# Patient Record
Sex: Female | Born: 1990 | Race: Black or African American | Hispanic: No | Marital: Single | Smoking: Never smoker
Health system: Southern US, Community
[De-identification: ages and names within clinical notes are randomized; demographics above are authoritative.]

## PROBLEM LIST (undated history)

## (undated) ENCOUNTER — Inpatient Hospital Stay (HOSPITAL_COMMUNITY): Payer: Self-pay

## (undated) DIAGNOSIS — Z789 Other specified health status: Secondary | ICD-10-CM

## (undated) HISTORY — PX: NO PAST SURGERIES: SHX2092

---
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
## 2013-03-17 NOTE — L&D Delivery Note (Signed)
Delivery Note At 12:54 AM a viable female was delivered via  (Presentation: OA ;  ).  APGAR: 9 - 9 , ; weight: 3150 grams .   Placenta status: spontaneous, intact , .  Cord: 3-vessel,  with the following complications: none .  Cord pH: none  Anesthesia:  none Episiotomy:  none Lacerations: none Suture Repair: none Est. Blood Loss (mL): 350  Mom to postpartum.  Baby to Couplet care / Skin to Skin.  Kristen Grimes A 11/16/2013, 1:24 AM

## 2013-03-25 ENCOUNTER — Encounter: Payer: Medicaid Other | Admitting: Advanced Practice Midwife

## 2013-04-10 ENCOUNTER — Inpatient Hospital Stay (HOSPITAL_COMMUNITY)
Admission: AD | Admit: 2013-04-10 | Discharge: 2013-04-10 | Disposition: A | Discharge status: Home / Self Care | Payer: Medicaid Other | Source: Ambulatory Visit | Attending: Obstetrics and Gynecology | Admitting: Obstetrics and Gynecology

## 2013-04-10 ENCOUNTER — Encounter (HOSPITAL_COMMUNITY): Payer: Self-pay | Admitting: Family

## 2013-04-10 ENCOUNTER — Inpatient Hospital Stay (HOSPITAL_COMMUNITY): Payer: Self-pay

## 2013-04-10 DIAGNOSIS — R102 Pelvic and perineal pain: Secondary | ICD-10-CM

## 2013-04-10 DIAGNOSIS — O26891 Other specified pregnancy related conditions, first trimester: Secondary | ICD-10-CM

## 2013-04-10 DIAGNOSIS — N949 Unspecified condition associated with female genital organs and menstrual cycle: Secondary | ICD-10-CM

## 2013-04-10 DIAGNOSIS — O99891 Other specified diseases and conditions complicating pregnancy: Secondary | ICD-10-CM | POA: Insufficient documentation

## 2013-04-10 DIAGNOSIS — M549 Dorsalgia, unspecified: Secondary | ICD-10-CM | POA: Insufficient documentation

## 2013-04-10 DIAGNOSIS — O9989 Other specified diseases and conditions complicating pregnancy, childbirth and the puerperium: Secondary | ICD-10-CM

## 2013-04-10 DIAGNOSIS — R109 Unspecified abdominal pain: Secondary | ICD-10-CM | POA: Insufficient documentation

## 2013-04-10 HISTORY — DX: Other specified health status: Z78.9

## 2013-04-10 LAB — CBC
HCT: 37.8 % (ref 36.0–46.0)
Hemoglobin: 13.2 g/dL (ref 12.0–15.0)
MCH: 31 pg (ref 26.0–34.0)
MCHC: 34.9 g/dL (ref 30.0–36.0)
MCV: 88.7 fL (ref 78.0–100.0)
PLATELETS: 261 10*3/uL (ref 150–400)
RBC: 4.26 MIL/uL (ref 3.87–5.11)
RDW: 14.1 % (ref 11.5–15.5)
WBC: 6.2 10*3/uL (ref 4.0–10.5)

## 2013-04-10 LAB — WET PREP, GENITAL
Trich, Wet Prep: NONE SEEN
Yeast Wet Prep HPF POC: NONE SEEN

## 2013-04-10 LAB — URINALYSIS, ROUTINE W REFLEX MICROSCOPIC
Bilirubin Urine: NEGATIVE
Glucose, UA: NEGATIVE mg/dL
Hgb urine dipstick: NEGATIVE
Ketones, ur: NEGATIVE mg/dL
LEUKOCYTES UA: NEGATIVE
NITRITE: NEGATIVE
PROTEIN: NEGATIVE mg/dL
SPECIFIC GRAVITY, URINE: 1.025 (ref 1.005–1.030)
UROBILINOGEN UA: 0.2 mg/dL (ref 0.0–1.0)
pH: 6 (ref 5.0–8.0)

## 2013-04-10 LAB — ABO/RH: ABO/RH(D): B POS

## 2013-04-10 LAB — POCT PREGNANCY, URINE
PREG TEST UR: POSITIVE — AB
PREG TEST UR: POSITIVE — AB

## 2013-04-10 LAB — HCG, QUANTITATIVE, PREGNANCY: HCG, BETA CHAIN, QUANT, S: 139990 m[IU]/mL — AB (ref <5)

## 2013-04-10 NOTE — Discharge Instructions (Signed)
Pregnancy - First Trimester  During sexual intercourse, millions of sperm go into the vagina. Only 1 sperm will penetrate and fertilize the female egg while it is in the Fallopian tube. One week later, the fertilized egg implants into the wall of the uterus. An embryo begins to develop into a baby. At 6 to 8 weeks, the eyes and face are formed and the heartbeat can be seen on ultrasound. At the end of 12 weeks (first trimester), all the baby's organs are formed. Now that you are pregnant, you will want to do everything you can to have a healthy baby. Two of the most important things are to get good prenatal care and follow your caregiver's instructions. Prenatal care is all the medical care you receive before the baby's birth. It is given to prevent, find, and treat problems during the pregnancy and childbirth.  PRENATAL EXAMS  · During prenatal visits, your weight, blood pressure, and urine are checked. This is done to make sure you are healthy and progressing normally during the pregnancy.  · A pregnant woman should gain 25 to 35 pounds during the pregnancy. However, if you are overweight or underweight, your caregiver will advise you regarding your weight.  · Your caregiver will ask and answer questions for you.  · Blood work, cervical cultures, other necessary tests, and a Pap test are done during your prenatal exams. These tests are done to check on your health and the probable health of your baby. Tests are strongly recommended and done for HIV with your permission. This is the virus that causes AIDS. These tests are done because medicines can be given to help prevent your baby from being born with this infection should you have been infected without knowing it. Blood work is also used to find out your blood type, previous infections, and follow your blood levels (hemoglobin).  · Low hemoglobin (anemia) is common during pregnancy. Iron and vitamins are given to help prevent this. Later in the pregnancy, blood  tests for diabetes will be done along with any other tests if any problems develop.  · You may need other tests to make sure you and the baby are doing well.  CHANGES DURING THE FIRST TRIMESTER   Your body goes through many changes during pregnancy. They vary from person to person. Talk to your caregiver about changes you notice and are concerned about. Changes can include:  · Your menstrual period stops.  · The egg and sperm carry the genes that determine what you look like. Genes from you and your partner are forming a baby. The female genes determine whether the baby is a boy or a girl.  · Your body increases in girth and you may feel bloated.  · Feeling sick to your stomach (nauseous) and throwing up (vomiting). If the vomiting is uncontrollable, call your caregiver.  · Your breasts will begin to enlarge and become tender.  · Your nipples may stick out more and become darker.  · The need to urinate more. Painful urination may mean you have a bladder infection.  · Tiring easily.  · Loss of appetite.  · Cravings for certain kinds of food.  · At first, you may gain or lose a couple of pounds.  · You may have changes in your emotions from day to day (excited to be pregnant or concerned something may go wrong with the pregnancy and baby).  · You may have more vivid and strange dreams.  HOME CARE INSTRUCTIONS   ·   It is very important to avoid all smoking, alcohol and non-prescribed drugs during your pregnancy. These affect the formation and growth of the baby. Avoid chemicals while pregnant to ensure the delivery of a healthy infant.  · Start your prenatal visits by the 12th week of pregnancy. They are usually scheduled monthly at first, then more often in the last 2 months before delivery. Keep your caregiver's appointments. Follow your caregiver's instructions regarding medicine use, blood and lab tests, exercise, and diet.  · During pregnancy, you are providing food for you and your baby. Eat regular, well-balanced  meals. Choose foods such as meat, fish, milk and other low fat dairy products, vegetables, fruits, and whole-grain breads and cereals. Your caregiver will tell you of the ideal weight gain.  · You can help morning sickness by keeping soda crackers at the bedside. Eat a couple before arising in the morning. You may want to use the crackers without salt on them.  · Eating 4 to 5 small meals rather than 3 large meals a day also may help the nausea and vomiting.  · Drinking liquids between meals instead of during meals also seems to help nausea and vomiting.  · A physical sexual relationship may be continued throughout pregnancy if there are no other problems. Problems may be early (premature) leaking of amniotic fluid from the membranes, vaginal bleeding, or belly (abdominal) pain.  · Exercise regularly if there are no restrictions. Check with your caregiver or physical therapist if you are unsure of the safety of some of your exercises. Greater weight gain will occur in the last 2 trimesters of pregnancy. Exercising will help:  · Control your weight.  · Keep you in shape.  · Prepare you for labor and delivery.  · Help you lose your pregnancy weight after you deliver your baby.  · Wear a good support or jogging bra for breast tenderness during pregnancy. This may help if worn during sleep too.  · Ask when prenatal classes are available. Begin classes when they are offered.  · Do not use hot tubs, steam rooms, or saunas.  · Wear your seat belt when driving. This protects you and your baby if you are in an accident.  · Avoid raw meat, uncooked cheese, cat litter boxes, and soil used by cats throughout the pregnancy. These carry germs that can cause birth defects in the baby.  · The first trimester is a good time to visit your dentist for your dental health. Getting your teeth cleaned is okay. Use a softer toothbrush and brush gently during pregnancy.  · Ask for help if you have financial, counseling, or nutritional needs  during pregnancy. Your caregiver will be able to offer counseling for these needs as well as refer you for other special needs.  · Do not take any medicines or herbs unless told by your caregiver.  · Inform your caregiver if there is any mental or physical domestic violence.  · Make a list of emergency phone numbers of family, friends, hospital, and police and fire departments.  · Write down your questions. Take them to your prenatal visit.  · Do not douche.  · Do not cross your legs.  · If you have to stand for long periods of time, rotate you feet or take small steps in a circle.  · You may have more vaginal secretions that may require a sanitary pad. Do not use tampons or scented sanitary pads.  MEDICINES AND DRUG USE IN PREGNANCY  ·   Take prenatal vitamins as directed. The vitamin should contain 1 milligram of folic acid. Keep all vitamins out of reach of children. Only a couple vitamins or tablets containing iron may be fatal to a baby or young child when ingested.  · Avoid use of all medicines, including herbs, over-the-counter medicines, not prescribed or suggested by your caregiver. Only take over-the-counter or prescription medicines for pain, discomfort, or fever as directed by your caregiver. Do not use aspirin, ibuprofen, or naproxen unless directed by your caregiver.  · Let your caregiver also know about herbs you may be using.  · Alcohol is related to a number of birth defects. This includes fetal alcohol syndrome. All alcohol, in any form, should be avoided completely. Smoking will cause low birth rate and premature babies.  · Street or illegal drugs are very harmful to the baby. They are absolutely forbidden. A baby born to an addicted mother will be addicted at birth. The baby will go through the same withdrawal an adult does.  · Let your caregiver know about any medicines that you have to take and for what reason you take them.  SEEK MEDICAL CARE IF:   You have any concerns or worries during your  pregnancy. It is better to call with your questions if you feel they cannot wait, rather than worry about them.  SEEK IMMEDIATE MEDICAL CARE IF:   · An unexplained oral temperature above 102° F (38.9° C) develops, or as your caregiver suggests.  · You have leaking of fluid from the vagina (birth canal). If leaking membranes are suspected, take your temperature and inform your caregiver of this when you call.  · There is vaginal spotting or bleeding. Notify your caregiver of the amount and how many pads are used.  · You develop a bad smelling vaginal discharge with a change in the color.  · You continue to feel sick to your stomach (nauseated) and have no relief from remedies suggested. You vomit blood or coffee ground-like materials.  · You lose more than 2 pounds of weight in 1 week.  · You gain more than 2 pounds of weight in 1 week and you notice swelling of your face, hands, feet, or legs.  · You gain 5 pounds or more in 1 week (even if you do not have swelling of your hands, face, legs, or feet).  · You get exposed to German measles and have never had them.  · You are exposed to fifth disease or chickenpox.  · You develop belly (abdominal) pain. Round ligament discomfort is a common non-cancerous (benign) cause of abdominal pain in pregnancy. Your caregiver still must evaluate this.  · You develop headache, fever, diarrhea, pain with urination, or shortness of breath.  · You fall or are in a car accident or have any kind of trauma.  · There is mental or physical violence in your home.  Document Released: 02/25/2001 Document Revised: 11/26/2011 Document Reviewed: 08/29/2008  Exit[employer]® Patient Information ©2014 [employer].

## 2013-04-10 NOTE — MAU Provider Note (Signed)
History     CSN: [id number]  Arrival date and time: 04/10/13 1006   First Provider Initiated Contact with Patient 04/10/13 1037      Chief Complaint  Patient presents with  . Abdominal Pain  . Back Pain   Abdominal Pain  Back Pain Associated symptoms include abdominal pain.    Kristen Grimes is a 23 y.o. G1P1001 at Unknown who presents today with lower abdominal and back pain for 4 days. She did not know she was pregnant. She denies any bleeding or vaginal discharge.   Past Medical History  Diagnosis Date  . Medical history non-contributory     Past Surgical History  Procedure Laterality Date  . No past surgeries      History reviewed. No pertinent family history.  History  Substance Use Topics  . Smoking status: Never Smoker   . Smokeless tobacco: Never Used  . Alcohol Use: No    Allergies: No Known Allergies  Prescriptions prior to admission  Medication Sig Dispense Refill  . acetaminophen (TYLENOL) 325 MG tablet Take 325-650 mg by mouth every 6 (six) hours as needed for mild pain or headache.        Review of Systems  Gastrointestinal: Positive for abdominal pain.  Musculoskeletal: Positive for back pain.   Physical Exam   Blood pressure 119/64, pulse 85, temperature 98.1 F (36.7 C), temperature source Oral, resp. rate 14, height 5\' 8"  (1.727 m), weight 71.668 kg (158 lb), unknown if currently breastfeeding.  Physical Exam  Nursing note and vitals reviewed. Constitutional: She is oriented to person, place, and time. She appears well-developed and well-nourished. No distress.  Cardiovascular: Normal rate.   Respiratory: Effort normal.  GI: Soft. There is no tenderness.  Genitourinary:   External: no lesion Vagina: small amount of white discharge Cervix: pink, smooth, no CMT Uterus: NSSC Adnexa: NT   Neurological: She is alert and oriented to person, place, and time.  Skin: Skin is warm and dry.  Psychiatric: She has a normal mood and  affect.    MAU Course  Procedures  Results for orders placed during the hospital encounter of 04/10/13 (from the past 24 hour(s))  URINALYSIS, ROUTINE W REFLEX MICROSCOPIC     Status: None   Collection Time    04/10/13 10:15 AM      Result Value Range   Color, Urine YELLOW  YELLOW   APPearance CLEAR  CLEAR   Specific Gravity, Urine 1.025  1.005 - 1.030   pH 6.0  5.0 - 8.0   Glucose, UA NEGATIVE  NEGATIVE mg/dL   Hgb urine dipstick NEGATIVE  NEGATIVE   Bilirubin Urine NEGATIVE  NEGATIVE   Ketones, ur NEGATIVE  NEGATIVE mg/dL   Protein, ur NEGATIVE  NEGATIVE mg/dL   Urobilinogen, UA 0.2  0.0 - 1.0 mg/dL   Nitrite NEGATIVE  NEGATIVE   Leukocytes, UA NEGATIVE  NEGATIVE  POCT PREGNANCY, URINE     Status: Abnormal   Collection Time    04/10/13 10:24 AM      Result Value Range   Preg Test, Ur POSITIVE (*) NEGATIVE  CBC     Status: None   Collection Time    04/10/13 10:35 AM      Result Value Range   WBC 6.2  4.0 - 10.5 K/uL   RBC 4.26  3.87 - 5.11 MIL/uL   Hemoglobin 13.2  12.0 - 15.0 g/dL   HCT 40.937.8  81.136.0 - 91.446.0 %   MCV 88.7  78.0 - 100.0  fL   MCH 31.0  26.0 - 34.0 pg   MCHC 34.9  30.0 - 36.0 g/dL   RDW 40.9  81.1 - 91.4 %   Platelets 261  150 - 400 K/uL  ABO/RH     Status: None   Collection Time    04/10/13 10:35 AM      Result Value Range   ABO/RH(D) B POS    HCG, QUANTITATIVE, PREGNANCY     Status: Abnormal   Collection Time    04/10/13 10:35 AM      Result Value Range   hCG, Beta Chain, Quant, [place] [id number] (*) <5 mIU/mL  WET PREP, GENITAL     Status: Abnormal   Collection Time    04/10/13 10:50 AM      Result Value Range   Yeast Wet Prep HPF POC NONE SEEN  NONE SEEN   Trich, Wet Prep NONE SEEN  NONE SEEN   Clue Cells Wet Prep HPF POC FEW (*) NONE SEEN   WBC, Wet Prep HPF POC FEW (*) NONE SEEN   US Ob Comp Less 14 Wks  04/10/2013   CLINICAL DATA:  Pelvic pain.  Unknown last menstrual period.  EXAM: OBSTETRIC <14 WK ULTRASOUND  TECHNIQUE: Transabdominal ultrasound  was performed for evaluation of the gestation as well as the maternal uterus and adnexal regions.  COMPARISON:  None this pregnancy  FINDINGS: Intrauterine gestational sac: Visualized/normal in shape.  Yolk sac:  Visualized  Embryo:  Visualized  Cardiac Activity: Visualized  Heart Rate: 179 bpm  CRL:   26  mm   9 w 3d                  [place] EDC: 11/10/13  Maternal uterus/adnexae: Normal ovaries.  No free fluid.  IMPRESSION: Single live intrauterine gestation measuring 9 weeks 3 days by crown-rump length on today's exam, EDC by today's ultrasound 11/10/2013. No acute abnormality.   Electronically Signed   By: Christiana Pellant M.D.   On: 04/10/2013 13:12     Assessment and Plan   1. Pregnancy related pelvic pain in first trimester, antepartum    First trimester precautions given Start [facility] as soon as possible Return to MAU as needed Pregnancy verification letter given.   Follow-up Information   Schedule an appointment as soon as possible for a visit with [facility] [place].   Contact information:   [address] [place] [address] [phone]      Tawnya Crook 04/10/2013, 11:31 AM

## 2013-04-10 NOTE — MAU Note (Signed)
23 yo, presents to MAU with c/o abdominal pain and intermittent back pain x 4 days; reports constipation, denies N/V/D, fever, chills, sweats.  LMP 02/16/13. No BC.

## 2013-04-11 LAB — GC/CHLAMYDIA PROBE AMP
CT PROBE, AMP APTIMA: POSITIVE — AB
GC Probe RNA: NEGATIVE

## 2013-04-12 ENCOUNTER — Encounter (HOSPITAL_COMMUNITY): Payer: Self-pay | Admitting: *Deleted

## 2013-04-13 NOTE — MAU Provider Note (Signed)
Attestation of Attending Supervision of Advanced Practitioner: Evaluation and management procedures were performed by the PA/NP/CNM/OB Fellow under my supervision/collaboration. Chart reviewed and agree with management and plan.  Kip Cropp V 04/13/2013 5:24 PM   

## 2013-05-23 ENCOUNTER — Other Ambulatory Visit (HOSPITAL_COMMUNITY): Payer: Self-pay | Admitting: Nurse Practitioner

## 2013-05-23 DIAGNOSIS — Z3689 Encounter for other specified antenatal screening: Secondary | ICD-10-CM

## 2013-05-31 ENCOUNTER — Encounter: Payer: Medicaid Other | Admitting: Obstetrics

## 2013-05-31 ENCOUNTER — Encounter: Payer: Medicaid Other | Admitting: Advanced Practice Midwife

## 2013-06-14 ENCOUNTER — Ambulatory Visit: Payer: Medicaid Other

## 2013-06-14 ENCOUNTER — Encounter: Payer: Self-pay | Admitting: Obstetrics

## 2013-06-14 ENCOUNTER — Ambulatory Visit (INDEPENDENT_AMBULATORY_CARE_PROVIDER_SITE_OTHER): Payer: Medicaid Other | Admitting: Obstetrics

## 2013-06-14 VITALS — BP 120/70 | Temp 98.9°F | Wt 163.0 lb

## 2013-06-14 DIAGNOSIS — Z3480 Encounter for supervision of other normal pregnancy, unspecified trimester: Secondary | ICD-10-CM

## 2013-06-14 DIAGNOSIS — Z3687 Encounter for antenatal screening for uncertain dates: Secondary | ICD-10-CM

## 2013-06-14 DIAGNOSIS — J309 Allergic rhinitis, unspecified: Secondary | ICD-10-CM

## 2013-06-14 DIAGNOSIS — Z1389 Encounter for screening for other disorder: Secondary | ICD-10-CM

## 2013-06-14 DIAGNOSIS — J302 Other seasonal allergic rhinitis: Secondary | ICD-10-CM

## 2013-06-14 LAB — VITAMIN D 25 HYDROXY (VIT D DEFICIENCY, FRACTURES): VIT D 25 HYDROXY: 31 ng/mL (ref 30–89)

## 2013-06-14 LAB — WET PREP BY MOLECULAR PROBE
CANDIDA SPECIES: NEGATIVE
Gardnerella vaginalis: NEGATIVE
Trichomonas vaginosis: NEGATIVE

## 2013-06-14 LAB — OB RESULTS CONSOLE GC/CHLAMYDIA
Chlamydia: NEGATIVE
Gonorrhea: NEGATIVE

## 2013-06-14 LAB — HIV ANTIBODY (ROUTINE TESTING W REFLEX): HIV: NONREACTIVE

## 2013-06-14 MED ORDER — CITRANATAL HARMONY 27-1-250 MG PO CAPS: 1.0000 | ORAL_CAPSULE | Freq: Every day | ORAL | Status: DC

## 2013-06-14 MED ORDER — LORATADINE 10 MG PO TABS: 10.0000 mg | ORAL_TABLET | Freq: Every day | ORAL | Status: DC

## 2013-06-14 NOTE — Progress Notes (Signed)
Subjective:    Kristen Grimes is being seen today for her first obstetrical visit.  This is not a planned pregnancy. She is at 5564w6d gestation. Her obstetrical history is significant for None. Relationship with FOB: significant other, not living together. Patient Is unsure if she  intends to breast feed. Pregnancy history fully reviewed. Patient states she is unsure of her LMP. Patient states she was taking sudafed but stopped taking it 2 months ago. Patient states she has ran out of prenatal vitamins.  Pulse: 87  Menstrual History: OB History   Grav Para Term Preterm Abortions TAB SAB Ect Mult Living   2 1 1       1       Menarche age: [age] (Patient states she is unsure.) Patient's last menstrual period was 02/16/2013.    The following portions of the patient's history were reviewed and updated as appropriate: allergies, current medications, past family history, past medical history, past social history, past surgical history and problem list.  Review of Systems Pertinent items are noted in HPI.    Objective:    General appearance: alert and no distress Abdomen: normal findings: soft, non-tender Pelvic: cervix normal in appearance, external genitalia normal, no adnexal masses or tenderness, no cervical motion tenderness, rectovaginal septum normal and vagina normal without discharge Extremities: extremities normal, atraumatic, no cyanosis or edema    Assessment:    Pregnancy at 4764w6d weeks    Plan:    Initial labs drawn. Prenatal vitamins.  Counseling provided regarding continued use of seat belts, cessation of alcohol consumption, smoking or use of illicit drugs; infection precautions i.e., influenza/TDAP immunizations, toxoplasmosis,CMV, parvovirus, listeria and varicella; workplace safety, exercise during pregnancy; routine dental care, safe medications, sexual activity, hot tubs, saunas, pools, travel, caffeine use, fish and methlymercury, potential toxins, hair treatments,  varicose veins Weight gain recommendations per IOM guidelines reviewed: underweight/BMI< 18.5--> gain 28 - 40 lbs; normal weight/BMI 18.5 - 24.9--> gain 25 - 35 lbs; overweight/BMI 25 - 29.9--> gain 15 - 25 lbs; obese/BMI >30->gain  11 - 20 lbs Problem list reviewed and updated. FIRST/CF mutation testing/NIPT/QUAD SCREEN discussed: requested. Role of ultrasound in pregnancy discussed; fetal survey: requested. Amniocentesis discussed: not indicated. VBAC calculator score: VBAC consent form provided Follow up in 4 weeks. 50% of 20 min visit spent on counseling and coordination of care.

## 2013-06-15 LAB — OBSTETRIC PANEL
Antibody Screen: NEGATIVE
BASOS ABS: 0 10*3/uL (ref 0.0–0.1)
Basophils Relative: 0 % (ref 0–1)
EOS PCT: 1 % (ref 0–5)
Eosinophils Absolute: 0.1 10*3/uL (ref 0.0–0.7)
HEMATOCRIT: 39 % (ref 36.0–46.0)
Hemoglobin: 13.1 g/dL (ref 12.0–15.0)
Hepatitis B Surface Ag: NEGATIVE
LYMPHS ABS: 2.8 10*3/uL (ref 0.7–4.0)
LYMPHS PCT: 39 % (ref 12–46)
MCH: 30.5 pg (ref 26.0–34.0)
MCHC: 33.6 g/dL (ref 30.0–36.0)
MCV: 90.7 fL (ref 78.0–100.0)
MONO ABS: 0.6 10*3/uL (ref 0.1–1.0)
Monocytes Relative: 9 % (ref 3–12)
NEUTROS ABS: 3.6 10*3/uL (ref 1.7–7.7)
Neutrophils Relative %: 51 % (ref 43–77)
PLATELETS: 241 10*3/uL (ref 150–400)
RBC: 4.3 MIL/uL (ref 3.87–5.11)
RDW: 15 % (ref 11.5–15.5)
RUBELLA: 2.52 {index} — AB (ref <0.90)
Rh Type: POSITIVE
WBC: 7.1 10*3/uL (ref 4.0–10.5)

## 2013-06-15 LAB — VARICELLA ZOSTER ANTIBODY, IGG: VARICELLA IGG: 1995 {index} — AB (ref <135.00)

## 2013-06-16 LAB — HEMOGLOBINOPATHY EVALUATION
HGB F QUANT: 0 % (ref 0.0–2.0)
HGB S QUANTITAION: 0 %
Hemoglobin Other: 0 %
Hgb A2 Quant: 2.8 % (ref 2.2–3.2)
Hgb A: 97.2 % (ref 96.8–97.8)

## 2013-06-16 LAB — GC/CHLAMYDIA PROBE AMP
CT Probe RNA: NEGATIVE
GC Probe RNA: NEGATIVE

## 2013-06-18 ENCOUNTER — Encounter: Payer: Self-pay | Admitting: Obstetrics

## 2013-06-18 DIAGNOSIS — J302 Other seasonal allergic rhinitis: Secondary | ICD-10-CM | POA: Insufficient documentation

## 2013-06-24 ENCOUNTER — Ambulatory Visit (HOSPITAL_COMMUNITY)
Admission: RE | Admit: 2013-06-24 | Discharge: 2013-06-24 | Disposition: A | Discharge status: Home / Self Care | Payer: Medicaid Other | Source: Ambulatory Visit | Attending: Nurse Practitioner | Admitting: Nurse Practitioner

## 2013-06-24 DIAGNOSIS — Z3689 Encounter for other specified antenatal screening: Secondary | ICD-10-CM

## 2013-07-12 ENCOUNTER — Ambulatory Visit (INDEPENDENT_AMBULATORY_CARE_PROVIDER_SITE_OTHER): Payer: Medicaid Other | Admitting: Obstetrics

## 2013-07-12 VITALS — BP 119/67 | HR 79 | Temp 98.7°F | Wt 165.0 lb

## 2013-07-12 DIAGNOSIS — Z3480 Encounter for supervision of other normal pregnancy, unspecified trimester: Secondary | ICD-10-CM

## 2013-07-12 LAB — POCT URINALYSIS DIPSTICK
BILIRUBIN UA: NEGATIVE
Blood, UA: NEGATIVE
GLUCOSE UA: NEGATIVE
KETONES UA: NEGATIVE
LEUKOCYTES UA: NEGATIVE
NITRITE UA: NEGATIVE
PH UA: 8
PROTEIN UA: NEGATIVE
Spec Grav, UA: <=1.005
UROBILINOGEN UA: NEGATIVE

## 2013-07-13 ENCOUNTER — Encounter: Payer: Self-pay | Admitting: Obstetrics

## 2013-07-13 LAB — CULTURE, OB URINE
Colony Count: NO GROWTH
Organism ID, Bacteria: NO GROWTH

## 2013-07-13 NOTE — Progress Notes (Signed)
Subjective:    Margorie Johneicorah Pitt is a 23 y.o. female being seen today for her obstetrical visit. She is at 4532w6d gestation. Patient reports: no complaints . Fetal movement: normal.  Problem List Items Addressed This Visit   None    Visit Diagnoses   Supervision of other normal pregnancy    -  Primary    Relevant Orders       POCT urinalysis dipstick (Completed)       Culture, OB Urine      Patient Active Problem List   Diagnosis Date Noted  . Seasonal rhinitis 06/18/2013   Objective:    BP 119/67  Pulse 79  Temp(Src) 98.7 F (37.1 C)  Wt 165 lb (74.844 kg)  LMP 02/16/2013 FHT: 150 BPM  Uterine Size: size equals dates     Assessment:    Pregnancy @ 7232w6d    Plan:    OBGCT: discussed and ordered.  Labs, problem list reviewed and updated 2 hr GTT planned Follow up in 2 weeks.

## 2013-08-09 ENCOUNTER — Other Ambulatory Visit: Payer: Medicaid Other

## 2013-08-09 ENCOUNTER — Encounter: Payer: Self-pay | Admitting: Obstetrics

## 2013-08-09 ENCOUNTER — Ambulatory Visit (INDEPENDENT_AMBULATORY_CARE_PROVIDER_SITE_OTHER): Payer: Medicaid Other | Admitting: Obstetrics

## 2013-08-09 VITALS — BP 117/67 | HR 80 | Temp 97.4°F | Wt 174.0 lb

## 2013-08-09 DIAGNOSIS — Z3480 Encounter for supervision of other normal pregnancy, unspecified trimester: Secondary | ICD-10-CM

## 2013-08-09 LAB — POCT URINALYSIS DIPSTICK
Bilirubin, UA: NEGATIVE
Blood, UA: NEGATIVE
Glucose, UA: NEGATIVE
KETONES UA: NEGATIVE
Leukocytes, UA: NEGATIVE
Nitrite, UA: NEGATIVE
PH UA: 8
PROTEIN UA: NEGATIVE
Urobilinogen, UA: NEGATIVE

## 2013-08-09 LAB — CBC
HEMATOCRIT: 34.7 % — AB (ref 36.0–46.0)
Hemoglobin: 11.8 g/dL — ABNORMAL LOW (ref 12.0–15.0)
MCH: 30.7 pg (ref 26.0–34.0)
MCHC: 34 g/dL (ref 30.0–36.0)
MCV: 90.4 fL (ref 78.0–100.0)
PLATELETS: 233 10*3/uL (ref 150–400)
RBC: 3.84 MIL/uL — AB (ref 3.87–5.11)
RDW: 15.1 % (ref 11.5–15.5)
WBC: 7.4 10*3/uL (ref 4.0–10.5)

## 2013-08-09 NOTE — Progress Notes (Signed)
Subjective:    Kristen Grimes is a 23 y.o. female being seen today for her obstetrical visit. She is at 103w5d gestation. Patient reports: no complaints . Fetal movement: normal.  Problem List Items Addressed This Visit   None    Visit Diagnoses   Supervision of other normal pregnancy    -  Primary    Relevant Orders       POCT urinalysis dipstick       Glucose Tolerance, 2 Hours w/1 Hour       CBC       HIV antibody       RPR      Patient Active Problem List   Diagnosis Date Noted  . Seasonal rhinitis 06/18/2013   Objective:    BP 117/67  Pulse 80  Temp(Src) 97.4 F (36.3 C)  Wt 174 lb (78.926 kg)  LMP 02/16/2013 FHT: 150 BPM  Uterine Size: size equals dates     Assessment:    Pregnancy @ [redacted]w[redacted]d    Plan:    OBGCT: ordered.  Labs, problem list reviewed and updated 2 hr GTT planned Follow up in 2 weeks.

## 2013-08-10 LAB — HIV ANTIBODY (ROUTINE TESTING W REFLEX): HIV 1&2 Ab, 4th Generation: NONREACTIVE

## 2013-08-10 LAB — GLUCOSE TOLERANCE, 2 HOURS W/ 1HR
GLUCOSE, 2 HOUR: 71 mg/dL (ref 70–139)
GLUCOSE, FASTING: 59 mg/dL — AB (ref 70–99)
Glucose, 1 hour: 66 mg/dL — ABNORMAL LOW (ref 70–170)

## 2013-08-10 LAB — RPR

## 2013-09-05 ENCOUNTER — Encounter: Payer: Self-pay | Admitting: Obstetrics

## 2013-09-05 ENCOUNTER — Ambulatory Visit (INDEPENDENT_AMBULATORY_CARE_PROVIDER_SITE_OTHER): Payer: Medicaid Other | Admitting: Obstetrics

## 2013-09-05 VITALS — BP 108/68 | HR 81 | Temp 98.5°F | Wt 179.0 lb

## 2013-09-05 DIAGNOSIS — Z3480 Encounter for supervision of other normal pregnancy, unspecified trimester: Secondary | ICD-10-CM

## 2013-09-05 DIAGNOSIS — Z3483 Encounter for supervision of other normal pregnancy, third trimester: Secondary | ICD-10-CM

## 2013-09-05 NOTE — Progress Notes (Signed)
Subjective:    Kristen Grimes is a 23 y.o. female being seen today for her obstetrical visit. She is at 5857w4d gestation. Patient reports occasional contractions. Fetal movement: normal.  Problem List Items Addressed This Visit   None    Visit Diagnoses   Encounter for supervision of other normal pregnancy in third trimester    -  Primary    Relevant Orders       POCT urinalysis dipstick      Patient Active Problem List   Diagnosis Date Noted  . Seasonal rhinitis 06/18/2013   Objective:    BP 108/68  Pulse 81  Temp(Src) 98.5 F (36.9 C)  Wt 179 lb (81.194 kg)  LMP 02/16/2013 FHT:  150 BPM  Uterine Size: size equals dates  Presentation: unsure     Assessment:    Pregnancy @ 2857w4d weeks   Plan:     labs reviewed, problem list updated Consent signed. GBS sent TDAP offered  Rhogam given for RH negative Pediatrician: discussed. Infant feeding: plans to breastfeed. Maternity leave: discussed. Cigarette smoking: never smoked. Orders Placed This Encounter  Procedures  . POCT urinalysis dipstick   No orders of the defined types were placed in this encounter.   Follow up in 1 Week.

## 2013-09-07 LAB — POCT URINALYSIS DIPSTICK
Bilirubin, UA: NEGATIVE
Blood, UA: NEGATIVE
Glucose, UA: NEGATIVE
KETONES UA: NEGATIVE
LEUKOCYTES UA: NEGATIVE
NITRITE UA: NEGATIVE
PROTEIN UA: NEGATIVE
Spec Grav, UA: 1.02
Urobilinogen, UA: NEGATIVE
pH, UA: 6

## 2013-09-13 ENCOUNTER — Ambulatory Visit (INDEPENDENT_AMBULATORY_CARE_PROVIDER_SITE_OTHER): Payer: Medicaid Other | Admitting: Advanced Practice Midwife

## 2013-09-13 ENCOUNTER — Encounter: Payer: Self-pay | Admitting: Advanced Practice Midwife

## 2013-09-13 VITALS — BP 110/70 | HR 80 | Temp 98.1°F | Wt 178.0 lb

## 2013-09-13 DIAGNOSIS — Z3480 Encounter for supervision of other normal pregnancy, unspecified trimester: Secondary | ICD-10-CM

## 2013-09-13 DIAGNOSIS — Z3483 Encounter for supervision of other normal pregnancy, third trimester: Secondary | ICD-10-CM

## 2013-09-13 LAB — POCT URINALYSIS DIPSTICK
GLUCOSE UA: NEGATIVE
Ketones, UA: NEGATIVE
Nitrite, UA: NEGATIVE
Protein, UA: NEGATIVE
RBC UA: NEGATIVE
SPEC GRAV UA: 1.01
pH, UA: 7

## 2013-09-13 NOTE — Progress Notes (Signed)
Subjective: Kristen Grimes is a 23 y.o. at 31 weeks by 9wk US  Patient denies vaginal leaking of fluid or bleeding, denies contractions.  Reports positive fetal movment.  Denies concerns today.  Objective: Filed Vitals:   09/13/13 0938  BP: 110/70  Pulse: 80  Temp: 98.1 F (36.7 C)   150 FHR 31 Fundal Height Fetal Position cephalic  Assessment: Patient Active Problem List   Diagnosis Date Noted  . Seasonal rhinitis 06/18/2013    Plan: Patient to return to clinic in 2 weeks Reviewed plan for GBS @ 36 weeks. Reviewed warning signs in pregnancy. Patient to call with concerns PRN. Reviewed triage location. Discuss BCM, pediatrician NV.  Amy Wilson SingerWren CNM

## 2013-09-27 ENCOUNTER — Ambulatory Visit (INDEPENDENT_AMBULATORY_CARE_PROVIDER_SITE_OTHER): Payer: Medicaid Other | Admitting: Advanced Practice Midwife

## 2013-09-27 VITALS — BP 112/62 | HR 103 | Temp 97.9°F | Wt 181.0 lb

## 2013-09-27 DIAGNOSIS — K219 Gastro-esophageal reflux disease without esophagitis: Secondary | ICD-10-CM

## 2013-09-27 DIAGNOSIS — Z3480 Encounter for supervision of other normal pregnancy, unspecified trimester: Secondary | ICD-10-CM

## 2013-09-27 DIAGNOSIS — O9989 Other specified diseases and conditions complicating pregnancy, childbirth and the puerperium: Secondary | ICD-10-CM

## 2013-09-27 DIAGNOSIS — O99613 Diseases of the digestive system complicating pregnancy, third trimester: Secondary | ICD-10-CM

## 2013-09-27 DIAGNOSIS — Z3483 Encounter for supervision of other normal pregnancy, third trimester: Secondary | ICD-10-CM

## 2013-09-27 LAB — POCT URINALYSIS DIPSTICK
Bilirubin, UA: NEGATIVE
Blood, UA: NEGATIVE
Glucose, UA: NEGATIVE
KETONES UA: NEGATIVE
LEUKOCYTES UA: NEGATIVE
Nitrite, UA: NEGATIVE
Protein, UA: NEGATIVE
SPEC GRAV UA: 1.015
Urobilinogen, UA: NEGATIVE
pH, UA: 6.5

## 2013-09-27 MED ORDER — RANITIDINE HCL 150 MG PO TABS: 150.0000 mg | ORAL_TABLET | Freq: Two times a day (BID) | ORAL | Status: DC

## 2013-09-27 NOTE — Progress Notes (Signed)
Subjective: Margorie Johneicorah Galeas is a 23 y.o. at 33 weeks by 9 wk US  Patient denies vaginal leaking of fluid or bleeding, denies contractions.  Reports positive fetal movment.  Pt c/o HA, heartburn, back pain.   Objective: Filed Vitals:   09/27/13 1534  BP: 112/62  Pulse: 103  Temp: 97.9 F (36.6 C)   150 FHR 33 Fundal Height Fetal Position cephalic  Assessment: Patient Active Problem List   Diagnosis Date Noted  . Seasonal rhinitis 06/18/2013  IUP @ 33 wks GERD Common Discomforts of pregnancy  Plan: Patient to return to clinic in 2 weeks Gave pediatrician sheet today Patient uncertain of BCM PP, declines information. Zantac today for heartburn, evaluate NV Reviewed common discomforts of pregnancy and how to help w/ symptoms Reviewed warning signs of HA and pregnancy and warning signs of preeclampsia. Encouraged rest, tylenol, hydration and caffeine (within moderation). Reviewed warning signs in pregnancy. Patient to call with concerns PRN. Reviewed triage location.  Salwa Bai Wilson SingerWren CNM

## 2013-09-29 NOTE — Addendum Note (Signed)
Addended by: Odessa FlemingBOHNE, Shakeera Rightmyer M on: 09/29/2013 09:31 AM   Modules accepted: Orders

## 2013-10-11 ENCOUNTER — Ambulatory Visit (INDEPENDENT_AMBULATORY_CARE_PROVIDER_SITE_OTHER): Payer: Medicaid Other | Admitting: Obstetrics

## 2013-10-11 ENCOUNTER — Encounter: Payer: Self-pay | Admitting: Obstetrics

## 2013-10-11 VITALS — BP 115/67 | HR 83 | Temp 97.7°F | Wt 184.0 lb

## 2013-10-11 DIAGNOSIS — Z3483 Encounter for supervision of other normal pregnancy, third trimester: Secondary | ICD-10-CM

## 2013-10-11 DIAGNOSIS — Z3480 Encounter for supervision of other normal pregnancy, unspecified trimester: Secondary | ICD-10-CM

## 2013-10-11 LAB — POCT URINALYSIS DIPSTICK
Bilirubin, UA: NEGATIVE
Blood, UA: NEGATIVE
Glucose, UA: NEGATIVE
KETONES UA: NEGATIVE
LEUKOCYTES UA: NEGATIVE
NITRITE UA: NEGATIVE
PROTEIN UA: NEGATIVE
Spec Grav, UA: 1.02
Urobilinogen, UA: NEGATIVE
pH, UA: 5.5

## 2013-10-11 NOTE — Progress Notes (Signed)
Subjective:    Kristen Grimes is a 23 y.o. female being seen today for her obstetrical visit. She is at 2040w5d gestation. Patient reports no complaints. Fetal movement: normal.  Problem List Items Addressed This Visit   None    Visit Diagnoses   Encounter for supervision of other normal pregnancy in third trimester    -  Primary    Relevant Orders       POCT urinalysis dipstick (Completed)       Strep B DNA probe      Patient Active Problem List   Diagnosis Date Noted  . Seasonal rhinitis 06/18/2013   Objective:    BP 115/67  Pulse 83  Temp(Src) 97.7 F (36.5 C)  Wt 184 lb (83.462 kg)  LMP 02/16/2013 FHT:  150 BPM  Uterine Size: size equals dates  Presentation: unsure     Assessment:    Pregnancy @ 2140w5d weeks   Plan:     labs reviewed, problem list updated Consent signed. GBS sent TDAP offered  Rhogam given for RH negative Pediatrician: discussed. Infant feeding: plans to breastfeed. Maternity leave: discussed. Cigarette smoking: never smoked. Orders Placed This Encounter  Procedures  . Strep B DNA probe  . POCT urinalysis dipstick   No orders of the defined types were placed in this encounter.   Follow up in 1 Week.

## 2013-10-13 LAB — STREP B DNA PROBE: GBSP: NOT DETECTED

## 2013-10-18 ENCOUNTER — Ambulatory Visit (INDEPENDENT_AMBULATORY_CARE_PROVIDER_SITE_OTHER): Payer: Medicaid Other | Admitting: Obstetrics

## 2013-10-18 ENCOUNTER — Encounter: Payer: Self-pay | Admitting: Obstetrics

## 2013-10-18 VITALS — BP 105/71 | HR 91 | Temp 97.8°F | Wt 183.0 lb

## 2013-10-18 DIAGNOSIS — Z3483 Encounter for supervision of other normal pregnancy, third trimester: Secondary | ICD-10-CM

## 2013-10-18 DIAGNOSIS — Z3480 Encounter for supervision of other normal pregnancy, unspecified trimester: Secondary | ICD-10-CM

## 2013-10-18 LAB — POCT URINALYSIS DIPSTICK
Bilirubin, UA: NEGATIVE
Glucose, UA: NEGATIVE
Ketones, UA: NEGATIVE
Leukocytes, UA: NEGATIVE
NITRITE UA: NEGATIVE
PROTEIN UA: NEGATIVE
RBC UA: NEGATIVE
Spec Grav, UA: 1.02
UROBILINOGEN UA: NEGATIVE
pH, UA: 5

## 2013-10-18 NOTE — Progress Notes (Signed)
Subjective:    Kristen Grimes is a 23 y.o. female being seen today for her obstetrical visit. She is at 8637w5d gestation. Patient reports no complaints. Fetal movement: normal.  Problem List Items Addressed This Visit   None    Visit Diagnoses   Encounter for supervision of other normal pregnancy in third trimester    -  Primary    Relevant Orders       POCT urinalysis dipstick (Completed)      Patient Active Problem List   Diagnosis Date Noted  . Seasonal rhinitis 06/18/2013   Objective:    BP 105/71  Pulse 91  Temp(Src) 97.8 F (36.6 C)  Wt 183 lb (83.008 kg)  LMP 02/16/2013 FHT:  150 BPM  Uterine Size: size equals dates  Presentation: cephalic     Assessment:    Pregnancy @ 4337w5d weeks   Plan:     labs reviewed, problem list updated Consent signed. GBS sent TDAP offered  Rhogam given for RH negative Pediatrician: discussed. Infant feeding: plans to breastfeed. Maternity leave: discussed. Cigarette smoking: never smoked. Orders Placed This Encounter  Procedures  . POCT urinalysis dipstick   No orders of the defined types were placed in this encounter.   Follow up in 1 Week.

## 2013-10-25 ENCOUNTER — Encounter: Payer: Medicaid Other | Admitting: Obstetrics

## 2013-10-26 ENCOUNTER — Encounter: Payer: Medicaid Other | Admitting: Obstetrics

## 2013-11-01 ENCOUNTER — Ambulatory Visit (INDEPENDENT_AMBULATORY_CARE_PROVIDER_SITE_OTHER): Payer: Medicaid Other | Admitting: Obstetrics

## 2013-11-01 VITALS — BP 120/76 | Temp 98.3°F | Wt 187.0 lb

## 2013-11-01 DIAGNOSIS — Z3480 Encounter for supervision of other normal pregnancy, unspecified trimester: Secondary | ICD-10-CM

## 2013-11-01 DIAGNOSIS — Z3483 Encounter for supervision of other normal pregnancy, third trimester: Secondary | ICD-10-CM

## 2013-11-01 LAB — POCT URINALYSIS DIPSTICK
Bilirubin, UA: NEGATIVE
Glucose, UA: NEGATIVE
KETONES UA: NEGATIVE
Leukocytes, UA: NEGATIVE
Nitrite, UA: NEGATIVE
PH UA: 6
PROTEIN UA: NEGATIVE
RBC UA: NEGATIVE
SPEC GRAV UA: 1.015
UROBILINOGEN UA: NEGATIVE

## 2013-11-01 NOTE — Progress Notes (Signed)
Subjective:    Kristen Grimes is a 23 y.o. female being seen today for her obstetrical visit. She is at 1161w5d gestation. Patient reports no complaints. Fetal movement: normal.  Problem List Items Addressed This Visit   None    Visit Diagnoses   Encounter for supervision of other normal pregnancy in third trimester    -  Primary    Relevant Orders       POCT urinalysis dipstick (Completed)      Patient Active Problem List   Diagnosis Date Noted  . Seasonal rhinitis 06/18/2013    Objective:    BP 120/76  Temp(Src) 98.3 F (36.8 C)  Wt 187 lb (84.823 kg)  LMP 02/16/2013 FHT: 150 BPM  Uterine Size: size equals dates  Presentations: unsure  Pelvic Exam: Deferred    Assessment:    Pregnancy @ 5961w5d weeks   Plan:   Plans for delivery: Vaginal anticipated; labs reviewed; problem list updated Counseling: Consent signed. Infant feeding: plans to breastfeed. Cigarette smoking: never smoked. L&D discussion: symptoms of labor, discussed when to call, discussed what number to call, anesthetic/analgesic options reviewed and delivering clinician:  plans Physician. Postpartum supports and preparation: circumcision discussed and contraception plans discussed.  Follow up in 1 Week.

## 2013-11-08 ENCOUNTER — Ambulatory Visit (INDEPENDENT_AMBULATORY_CARE_PROVIDER_SITE_OTHER): Payer: Medicaid Other | Admitting: Obstetrics

## 2013-11-08 ENCOUNTER — Encounter: Payer: Self-pay | Admitting: Obstetrics

## 2013-11-08 VITALS — BP 112/67 | HR 73 | Temp 98.3°F | Wt 188.0 lb

## 2013-11-08 DIAGNOSIS — Z3480 Encounter for supervision of other normal pregnancy, unspecified trimester: Secondary | ICD-10-CM

## 2013-11-08 DIAGNOSIS — Z3483 Encounter for supervision of other normal pregnancy, third trimester: Secondary | ICD-10-CM

## 2013-11-08 LAB — POCT URINALYSIS DIPSTICK
BILIRUBIN UA: NEGATIVE
Glucose, UA: NEGATIVE
Ketones, UA: NEGATIVE
Nitrite, UA: NEGATIVE
PROTEIN UA: NEGATIVE
RBC UA: NEGATIVE
SPEC GRAV UA: 1.01
Urobilinogen, UA: NEGATIVE
pH, UA: 6.5

## 2013-11-08 NOTE — Progress Notes (Signed)
Subjective:    Kristen Grimes is a 23 y.o. female being seen today for her obstetrical visit. She is at [redacted]w[redacted]d gestation. Patient reports occasional contractions. Fetal movement: normal.  Problem List Items Addressed This Visit   None    Visit Diagnoses   Encounter for supervision of other normal pregnancy in third trimester    -  Primary    Relevant Orders       POCT urinalysis dipstick (Completed)      Patient Active Problem List   Diagnosis Date Noted  . Seasonal rhinitis 06/18/2013    Objective:    BP 112/67  Pulse 73  Temp(Src) 98.3 F (36.8 C)  Wt 188 lb (85.276 kg)  LMP 02/16/2013 FHT: 150 BPM  Uterine Size: size equals dates  Presentations: cephalic  Pelvic Exam:              Dilation: 1cm       Effacement: 70%             Station:  -1    Consistency: soft            Position: posterior     Assessment:    Pregnancy @ [redacted]w[redacted]d weeks   Plan:   Plans for delivery: Vaginal anticipated; labs reviewed; problem list updated Counseling: Consent signed. Infant feeding: plans to breastfeed. Cigarette smoking: never smoked. L&D discussion: symptoms of labor, discussed when to call, discussed what number to call, anesthetic/analgesic options reviewed and delivering clinician:  plans Physician. Postpartum supports and preparation: circumcision discussed and contraception plans discussed.  Follow up in 1 Week.

## 2013-11-15 ENCOUNTER — Inpatient Hospital Stay (HOSPITAL_COMMUNITY)
Admission: AD | Admit: 2013-11-15 | Discharge: 2013-11-17 | DRG: 775 | Disposition: A | Discharge status: Home / Self Care | Payer: Medicaid Other | Source: Ambulatory Visit | Attending: Obstetrics | Admitting: Obstetrics

## 2013-11-15 ENCOUNTER — Encounter (HOSPITAL_COMMUNITY): Payer: Self-pay | Admitting: *Deleted

## 2013-11-15 ENCOUNTER — Encounter: Payer: Medicaid Other | Admitting: Obstetrics

## 2013-11-15 ENCOUNTER — Inpatient Hospital Stay (HOSPITAL_COMMUNITY)
Admission: AD | Admit: 2013-11-15 | Discharge: 2013-11-15 | DRG: 780 | Disposition: A | Discharge status: Home / Self Care | Payer: Medicaid Other | Source: Ambulatory Visit | Attending: Obstetrics | Admitting: Obstetrics

## 2013-11-15 DIAGNOSIS — O479 False labor, unspecified: Secondary | ICD-10-CM | POA: Diagnosis present

## 2013-11-15 DIAGNOSIS — IMO0001 Reserved for inherently not codable concepts without codable children: Secondary | ICD-10-CM

## 2013-11-15 LAB — CBC
HEMATOCRIT: 38.2 % (ref 36.0–46.0)
Hemoglobin: 13.3 g/dL (ref 12.0–15.0)
MCH: 30.8 pg (ref 26.0–34.0)
MCHC: 34.8 g/dL (ref 30.0–36.0)
MCV: 88.4 fL (ref 78.0–100.0)
PLATELETS: 199 10*3/uL (ref 150–400)
RBC: 4.32 MIL/uL (ref 3.87–5.11)
RDW: 15 % (ref 11.5–15.5)
WBC: 5.5 10*3/uL (ref 4.0–10.5)

## 2013-11-15 MED ORDER — LIDOCAINE HCL (PF) 1 % IJ SOLN: 30.0000 mL | INTRAMUSCULAR | Status: DC | PRN

## 2013-11-15 MED ORDER — NALBUPHINE HCL 10 MG/ML IJ SOLN
10.0000 mg | INTRAMUSCULAR | Status: DC | PRN
Start: 2013-11-15 — End: 2013-11-15

## 2013-11-15 MED ORDER — CITRIC ACID-SODIUM CITRATE 334-500 MG/5ML PO SOLN: 30.0000 mL | ORAL | Status: DC | PRN

## 2013-11-15 MED ORDER — IBUPROFEN 600 MG PO TABS: 600.0000 mg | ORAL_TABLET | Freq: Four times a day (QID) | ORAL | Status: DC | PRN

## 2013-11-15 MED ORDER — PROMETHAZINE HCL 25 MG/ML IJ SOLN: 25.0000 mg | Freq: Once | INTRAMUSCULAR | Status: DC

## 2013-11-15 MED ORDER — NALBUPHINE HCL 10 MG/ML IJ SOLN: 10.0000 mg | Freq: Four times a day (QID) | INTRAMUSCULAR | Status: DC | PRN

## 2013-11-15 MED ORDER — FLEET ENEMA 7-19 GM/118ML RE ENEM: 1.0000 | ENEMA | RECTAL | Status: DC | PRN

## 2013-11-15 MED ORDER — OXYTOCIN 40 UNITS IN LACTATED RINGERS INFUSION - SIMPLE MED: 62.5000 mL/h | INTRAVENOUS | Status: DC

## 2013-11-15 MED ORDER — ONDANSETRON HCL 4 MG/2ML IJ SOLN: 4.0000 mg | Freq: Four times a day (QID) | INTRAMUSCULAR | Status: DC | PRN

## 2013-11-15 MED ORDER — OXYTOCIN BOLUS FROM INFUSION: 500.0000 mL | INTRAVENOUS | Status: DC

## 2013-11-15 MED ORDER — ACETAMINOPHEN 325 MG PO TABS: 650.0000 mg | ORAL_TABLET | ORAL | Status: DC | PRN

## 2013-11-15 MED ORDER — LACTATED RINGERS IV SOLN
500.0000 mL | INTRAVENOUS | Status: DC | PRN
Start: 2013-11-15 — End: 2013-11-15

## 2013-11-15 MED ORDER — LACTATED RINGERS IV SOLN: INTRAVENOUS | Status: DC

## 2013-11-15 MED ORDER — OXYCODONE-ACETAMINOPHEN 5-325 MG PO TABS: 1.0000 | ORAL_TABLET | ORAL | Status: DC | PRN

## 2013-11-15 NOTE — MAU Note (Signed)
Contractions woke her at 0300, getting stronger and closer. Small amt of mucus and blood.  No problems with preg.  Was 1-2 on Sunday

## 2013-11-15 NOTE — Discharge Summary (Signed)
Physician Discharge Summary  Patient ID: Kristen Grimes MRN: [id number] DOB/AGE: 1991-01-23 22 y.o.  Admit date: 11/15/2013 Discharge date: 11/15/2013  Admission Diagnoses:  40.5 weeks.  Uterine contractions.  Discharge Diagnoses: Same.  Not in labor Active Problems:   Active labor   Discharged Condition: good  Hospital Course: Admitted with strong, regular UC's.  Contractions ceased after observation.  Fetal monitoring reactive, no decels.  Discussed with patient and she requested to go home and wait on spontaneous labor vs starting IOL today.  Labor instructions given.  Will F/U in `office in 3 days.  Consults: None  Significant Diagnostic Studies: NST  Treatments: IV hydration  Discharge Exam: Blood pressure 108/64, pulse 74, temperature 98.1 F (36.7 C), temperature source Oral, resp. rate 18, height 5' 6.5" (1.689 m), weight 187 lb (84.823 kg), last menstrual period 02/16/2013, SpO2 97.00%. General appearance: alert and no distress GI: normal findings: soft, non-tender Extremities: extremities normal, atraumatic, no cyanosis or edema Cervix:  3cm/ 60%/ -3/ Vertex  Disposition: 01-Home or Self Care  Discharge Instructions   Discharge patient    Complete by:  As directed             Medication List         acetaminophen 325 MG tablet  Commonly known as:  TYLENOL  Take 325 mg by mouth every 6 (six) hours as needed for mild pain or headache.     CITRANATAL HARMONY 27-1-250 MG Caps  Take 1 capsule by mouth daily before breakfast.           Follow-up Information   Follow up with HARPER,CHARLES A, MD In 3 days.   Specialty:  Obstetrics and Gynecology   Contact information:   [address] [place] [address] [phone]       Signed: Brock Bad 11/15/2013, 4:36 PM

## 2013-11-15 NOTE — Discharge Instructions (Signed)
Fetal Movement Counts °Patient Name: __________________________________________________ Patient Due Date: ____________________ °Performing a fetal movement count is highly recommended in high-risk pregnancies, but it is good for every pregnant woman to do. Your health care provider may ask you to start counting fetal movements at 28 weeks of the pregnancy. Fetal movements often increase: °· After eating a full meal. °· After physical activity. °· After eating or drinking something sweet or cold. °· At rest. °Pay attention to when you feel the baby is most active. This will help you notice a pattern of your baby's sleep and wake cycles and what factors contribute to an increase in fetal movement. It is important to perform a fetal movement count at the same time each day when your baby is normally most active.  °HOW TO COUNT FETAL MOVEMENTS °1. Find a quiet and comfortable area to sit or lie down on your left side. Lying on your left side provides the best blood and oxygen circulation to your baby. °2. Write down the day and time on a sheet of paper or in a journal. °3. Start counting kicks, flutters, swishes, rolls, or jabs in a 2-hour period. You should feel at least 10 movements within 2 hours. °4. If you do not feel 10 movements in 2 hours, wait 2-3 hours and count again. Look for a change in the pattern or not enough counts in 2 hours. °SEEK MEDICAL CARE IF: °· You feel less than 10 counts in 2 hours, tried twice. °· There is no movement in over an hour. °· The pattern is changing or taking longer each day to reach 10 counts in 2 hours. °· You feel the baby is not moving as he or she usually does. °Date: ____________ Movements: ____________ Start time: ____________ Finish time: ____________  °Date: ____________ Movements: ____________ Start time: ____________ Finish time: ____________ °Date: ____________ Movements: ____________ Start time: ____________ Finish time: ____________ °Date: ____________ Movements:  ____________ Start time: ____________ Finish time: ____________ °Date: ____________ Movements: ____________ Start time: ____________ Finish time: ____________ °Date: ____________ Movements: ____________ Start time: ____________ Finish time: ____________ °Date: ____________ Movements: ____________ Start time: ____________ Finish time: ____________ °Date: ____________ Movements: ____________ Start time: ____________ Finish time: ____________  °Date: ____________ Movements: ____________ Start time: ____________ Finish time: ____________ °Date: ____________ Movements: ____________ Start time: ____________ Finish time: ____________ °Date: ____________ Movements: ____________ Start time: ____________ Finish time: ____________ °Date: ____________ Movements: ____________ Start time: ____________ Finish time: ____________ °Date: ____________ Movements: ____________ Start time: ____________ Finish time: ____________ °Date: ____________ Movements: ____________ Start time: ____________ Finish time: ____________ °Date: ____________ Movements: ____________ Start time: ____________ Finish time: ____________  °Date: ____________ Movements: ____________ Start time: ____________ Finish time: ____________ °Date: ____________ Movements: ____________ Start time: ____________ Finish time: ____________ °Date: ____________ Movements: ____________ Start time: ____________ Finish time: ____________ °Date: ____________ Movements: ____________ Start time: ____________ Finish time: ____________ °Date: ____________ Movements: ____________ Start time: ____________ Finish time: ____________ °Date: ____________ Movements: ____________ Start time: ____________ Finish time: ____________ °Date: ____________ Movements: ____________ Start time: ____________ Finish time: ____________  °Date: ____________ Movements: ____________ Start time: ____________ Finish time: ____________ °Date: ____________ Movements: ____________ Start time: ____________ Finish  time: ____________ °Date: ____________ Movements: ____________ Start time: ____________ Finish time: ____________ °Date: ____________ Movements: ____________ Start time: ____________ Finish time: ____________ °Date: ____________ Movements: ____________ Start time: ____________ Finish time: ____________ °Date: ____________ Movements: ____________ Start time: ____________ Finish time: ____________ °Date: ____________ Movements: ____________ Start time: ____________ Finish time: ____________  °Date: ____________ Movements: ____________ Start time: ____________ Finish   time: ____________ °Date: ____________ Movements: ____________ Start time: ____________ Finish time: ____________ °Date: ____________ Movements: ____________ Start time: ____________ Finish time: ____________ °Date: ____________ Movements: ____________ Start time: ____________ Finish time: ____________ °Date: ____________ Movements: ____________ Start time: ____________ Finish time: ____________ °Date: ____________ Movements: ____________ Start time: ____________ Finish time: ____________ °Date: ____________ Movements: ____________ Start time: ____________ Finish time: ____________  °Date: ____________ Movements: ____________ Start time: ____________ Finish time: ____________ °Date: ____________ Movements: ____________ Start time: ____________ Finish time: ____________ °Date: ____________ Movements: ____________ Start time: ____________ Finish time: ____________ °Date: ____________ Movements: ____________ Start time: ____________ Finish time: ____________ °Date: ____________ Movements: ____________ Start time: ____________ Finish time: ____________ °Date: ____________ Movements: ____________ Start time: ____________ Finish time: ____________ °Date: ____________ Movements: ____________ Start time: ____________ Finish time: ____________  °Date: ____________ Movements: ____________ Start time: ____________ Finish time: ____________ °Date: ____________  Movements: ____________ Start time: ____________ Finish time: ____________ °Date: ____________ Movements: ____________ Start time: ____________ Finish time: ____________ °Date: ____________ Movements: ____________ Start time: ____________ Finish time: ____________ °Date: ____________ Movements: ____________ Start time: ____________ Finish time: ____________ °Date: ____________ Movements: ____________ Start time: ____________ Finish time: ____________ °Date: ____________ Movements: ____________ Start time: ____________ Finish time: ____________  °Date: ____________ Movements: ____________ Start time: ____________ Finish time: ____________ °Date: ____________ Movements: ____________ Start time: ____________ Finish time: ____________ °Date: ____________ Movements: ____________ Start time: ____________ Finish time: ____________ °Date: ____________ Movements: ____________ Start time: ____________ Finish time: ____________ °Date: ____________ Movements: ____________ Start time: ____________ Finish time: ____________ °Date: ____________ Movements: ____________ Start time: ____________ Finish time: ____________ °Document Released: 04/02/2006 Document Revised: 07/18/2013 Document Reviewed: 12/29/2011 °[employer]® Patient Information ©2015 [employer]. This information is not intended to replace advice given to you by your health care provider. Make sure you discuss any questions you have with your health care provider. °Braxton Hicks Contractions °Contractions of the uterus can occur throughout pregnancy. Contractions are not always a sign that you are in labor.  °WHAT ARE BRAXTON HICKS CONTRACTIONS?  °Contractions that occur before labor are called Braxton Hicks contractions, or false labor. Toward the end of pregnancy (32-34 weeks), these contractions can develop more often and may become more forceful. This is not true labor because these contractions do not result in opening (dilatation) and thinning of the cervix. They  are sometimes difficult to tell apart from true labor because these contractions can be forceful and people have different pain tolerances. You should not feel embarrassed if you go to the hospital with false labor. Sometimes, the only way to tell if you are in true labor is for your health care provider to look for changes in the cervix. °If there are no prenatal problems or other health problems associated with the pregnancy, it is completely safe to be sent home with false labor and await the onset of true labor. °HOW CAN YOU TELL THE DIFFERENCE BETWEEN TRUE AND FALSE LABOR? °False Labor °· The contractions of false labor are usually shorter and not as hard as those of true labor.   °· The contractions are usually irregular.   °· The contractions are often felt in the front of the lower abdomen and in the groin.   °· The contractions may go away when you walk around or change positions while lying down.   °· The contractions get weaker and are shorter lasting as time goes on.   °· The contractions do not usually become progressively stronger, regular, and closer together as with true labor.   °True Labor °· Contractions in true labor last 30-70 seconds, become   very regular, usually become more intense, and increase in frequency.   °· The contractions do not go away with walking.   °· The discomfort is usually felt in the top of the uterus and spreads to the lower abdomen and low back.   °· True labor can be determined by your health care provider with an exam. This will show that the cervix is dilating and getting thinner.   °WHAT TO REMEMBER °· Keep up with your usual exercises and follow other instructions given by your health care provider.   °· Take medicines as directed by your health care provider.   °· Keep your regular prenatal appointments.   °· Eat and drink lightly if you think you are going into labor.   °· If Braxton Hicks contractions are making you uncomfortable:   °¨ Change your position from  lying down or resting to walking, or from walking to resting.   °¨ Sit and rest in a tub of warm water.   °¨ Drink 2-3 glasses of water. Dehydration may cause these contractions.   °¨ Do slow and deep breathing several times an hour.   °WHEN SHOULD I SEEK IMMEDIATE MEDICAL CARE? °Seek immediate medical care if: °· Your contractions become stronger, more regular, and closer together.   °· You have fluid leaking or gushing from your vagina.   °· You have a fever.   °· You pass blood-tinged mucus.   °· You have vaginal bleeding.   °· You have continuous abdominal pain.   °· You have low back pain that you never had before.   °· You feel your baby's head pushing down and causing pelvic pressure.   °· Your baby is not moving as much as it used to.   °Document Released: 03/03/2005 Document Revised: 03/08/2013 Document Reviewed: 12/13/2012 °[employer]® Patient Information ©2015 [employer]. This information is not intended to replace advice given to you by your health care provider. Make sure you discuss any questions you have with your health care provider. ° °

## 2013-11-15 NOTE — H&P (Signed)
Kristen Grimes is a 23 y.o. female presenting for uc's. Maternal Medical History:  Reason for admission: Contractions.   Fetal activity: Perceived fetal activity is normal.   Last perceived fetal movement was within the past hour.    Prenatal Complications - Diabetes: none.    OB History   Grav Para Term Preterm Abortions TAB SAB Ect Mult Living   Past Medical History  Diagnosis Date  . Medical history non-contributory    Past Surgical History  Procedure Laterality Date  . No past surgeries     Family History: family history includes Multiple sclerosis in her mother. Social History:  reports that she has never smoked. She has never used smokeless tobacco. She reports that she does not drink alcohol or use illicit drugs.   Prenatal Transfer Tool  Maternal Diabetes: No Genetic Screening: Normal Maternal Ultrasounds/Referrals: Normal Fetal Ultrasounds or other Referrals:  None Maternal Substance Abuse:  No Significant Maternal Medications:  None Significant Maternal Lab Results:  None Other Comments:  None  Review of Systems  All other systems reviewed and are negative.   Dilation: 3.5 Effacement (%): 60 Station: -3 Exam by:: Kristen German RN Blood pressure 108/64, pulse 74, temperature 98.1 F (36.7 C), temperature source Oral, resp. rate 18, height 5' 6.5" (1.689 m), weight 187 lb (84.823 kg), last menstrual period 02/16/2013, SpO2 97.00%. Maternal Exam:  Abdomen: Patient reports no abdominal tenderness. Fetal presentation: vertex  Introitus: Normal vulva. Normal vagina.  Pelvis: adequate for delivery.   Cervix: Cervix evaluated by digital exam.     Physical Exam  Nursing note and vitals reviewed. Constitutional: She is oriented to person, place, and time. She appears well-developed and well-nourished.  HENT:  Head: Normocephalic and atraumatic.  Eyes: Conjunctivae are normal. Pupils are equal, round, and reactive to light.  Neck: Normal range  of motion. Neck supple.  Cardiovascular: Normal rate and regular rhythm.   Respiratory: Effort normal and breath sounds normal.  GI: Soft.  Genitourinary: Vagina normal and uterus normal.  Musculoskeletal: Normal range of motion.  Neurological: She is alert and oriented to person, place, and time.  Skin: Skin is warm and dry.  Psychiatric: She has a normal mood and affect. Her behavior is normal. Judgment and thought content normal.    Prenatal labs: ABO, Rh: B/POS/-- (03/31 1228) Antibody: NEG (03/31 1228) Rubella: 2.52 (03/31 1228) RPR: NON REAC (05/26 1400)  HBsAg: NEGATIVE (03/31 1228)  HIV: NONREACTIVE (05/26 1400)  GBS: NOT DETECTED (07/28 1114)   Assessment/Plan: 40.5 weeks.  Active labor.  Admit.   Kristen Grimes A 11/15/2013, 4:15 PM

## 2013-11-15 NOTE — Progress Notes (Signed)
Dr Clearance Coots called with an appointment for pt to be seen on Friday at 1000.  Pt notified of this appointment and she expressed understanding.

## 2013-11-16 ENCOUNTER — Encounter (HOSPITAL_COMMUNITY): Payer: Self-pay | Admitting: General Practice

## 2013-11-16 DIAGNOSIS — O479 False labor, unspecified: Secondary | ICD-10-CM | POA: Diagnosis present

## 2013-11-16 LAB — CBC
HCT: 37.7 % (ref 36.0–46.0)
HEMATOCRIT: 39.8 % (ref 36.0–46.0)
HEMOGLOBIN: 13.7 g/dL (ref 12.0–15.0)
Hemoglobin: 12.9 g/dL (ref 12.0–15.0)
MCH: 30.1 pg (ref 26.0–34.0)
MCH: 30.5 pg (ref 26.0–34.0)
MCHC: 34.2 g/dL (ref 30.0–36.0)
MCHC: 34.4 g/dL (ref 30.0–36.0)
MCV: 88.1 fL (ref 78.0–100.0)
MCV: 88.6 fL (ref 78.0–100.0)
PLATELETS: 209 10*3/uL (ref 150–400)
Platelets: 195 10*3/uL (ref 150–400)
RBC: 4.28 MIL/uL (ref 3.87–5.11)
RBC: 4.49 MIL/uL (ref 3.87–5.11)
RDW: 15 % (ref 11.5–15.5)
RDW: 15.1 % (ref 11.5–15.5)
WBC: 6.8 10*3/uL (ref 4.0–10.5)
WBC: 9.8 10*3/uL (ref 4.0–10.5)

## 2013-11-16 LAB — RPR

## 2013-11-16 MED ORDER — LIDOCAINE HCL (PF) 1 % IJ SOLN
30.0000 mL | INTRAMUSCULAR | Status: DC | PRN
  Filled 2013-11-16: qty 30

## 2013-11-16 MED ORDER — TETANUS-DIPHTH-ACELL PERTUSSIS 5-2.5-18.5 LF-MCG/0.5 IM SUSP
0.5000 mL | Freq: Once | INTRAMUSCULAR | Status: DC
Start: 2013-11-17 — End: 2013-11-17

## 2013-11-16 MED ORDER — FLEET ENEMA 7-19 GM/118ML RE ENEM: 1.0000 | ENEMA | RECTAL | Status: DC | PRN

## 2013-11-16 MED ORDER — OXYCODONE-ACETAMINOPHEN 5-325 MG PO TABS: 2.0000 | ORAL_TABLET | ORAL | Status: DC | PRN

## 2013-11-16 MED ORDER — SIMETHICONE 80 MG PO CHEW: 80.0000 mg | CHEWABLE_TABLET | ORAL | Status: DC | PRN

## 2013-11-16 MED ORDER — IBUPROFEN 600 MG PO TABS
600.0000 mg | ORAL_TABLET | Freq: Four times a day (QID) | ORAL | Status: DC
  Administered 2013-11-16 – 2013-11-17 (×6): 600 mg via ORAL
  Filled 2013-11-16 (×6): qty 1

## 2013-11-16 MED ORDER — DIPHENHYDRAMINE HCL 25 MG PO CAPS: 25.0000 mg | ORAL_CAPSULE | Freq: Four times a day (QID) | ORAL | Status: DC | PRN

## 2013-11-16 MED ORDER — OXYCODONE-ACETAMINOPHEN 5-325 MG PO TABS: 1.0000 | ORAL_TABLET | ORAL | Status: DC | PRN

## 2013-11-16 MED ORDER — CITRIC ACID-SODIUM CITRATE 334-500 MG/5ML PO SOLN: 30.0000 mL | ORAL | Status: DC | PRN

## 2013-11-16 MED ORDER — OXYTOCIN BOLUS FROM INFUSION
500.0000 mL | INTRAVENOUS | Status: DC
  Administered 2013-11-16: 500 mL via INTRAVENOUS

## 2013-11-16 MED ORDER — SENNOSIDES-DOCUSATE SODIUM 8.6-50 MG PO TABS
2.0000 | ORAL_TABLET | ORAL | Status: DC
  Administered 2013-11-17: 2 via ORAL
  Filled 2013-11-16: qty 2

## 2013-11-16 MED ORDER — BENZOCAINE-MENTHOL 20-0.5 % EX AERO: 1.0000 "application " | INHALATION_SPRAY | CUTANEOUS | Status: DC | PRN

## 2013-11-16 MED ORDER — PRENATAL MULTIVITAMIN CH
1.0000 | ORAL_TABLET | Freq: Every day | ORAL | Status: DC
  Administered 2013-11-16 – 2013-11-17 (×2): 1 via ORAL
  Filled 2013-11-16 (×2): qty 1

## 2013-11-16 MED ORDER — OXYTOCIN 40 UNITS IN LACTATED RINGERS INFUSION - SIMPLE MED
62.5000 mL/h | INTRAVENOUS | Status: DC
  Filled 2013-11-16: qty 1000

## 2013-11-16 MED ORDER — LACTATED RINGERS IV SOLN: 500.0000 mL | INTRAVENOUS | Status: DC | PRN

## 2013-11-16 MED ORDER — ACETAMINOPHEN 325 MG PO TABS: 650.0000 mg | ORAL_TABLET | ORAL | Status: DC | PRN

## 2013-11-16 MED ORDER — ZOLPIDEM TARTRATE 5 MG PO TABS: 5.0000 mg | ORAL_TABLET | Freq: Every evening | ORAL | Status: DC | PRN

## 2013-11-16 MED ORDER — OXYTOCIN 40 UNITS IN LACTATED RINGERS INFUSION - SIMPLE MED: 62.5000 mL/h | INTRAVENOUS | Status: DC | PRN

## 2013-11-16 MED ORDER — ONDANSETRON HCL 4 MG/2ML IJ SOLN: 4.0000 mg | INTRAMUSCULAR | Status: DC | PRN

## 2013-11-16 MED ORDER — ONDANSETRON HCL 4 MG/2ML IJ SOLN: 4.0000 mg | Freq: Four times a day (QID) | INTRAMUSCULAR | Status: DC | PRN

## 2013-11-16 MED ORDER — LACTATED RINGERS IV SOLN
INTRAVENOUS | Status: DC
  Administered 2013-11-16: 01:00:00 via INTRAVENOUS

## 2013-11-16 MED ORDER — DIBUCAINE 1 % RE OINT: 1.0000 "application " | TOPICAL_OINTMENT | RECTAL | Status: DC | PRN

## 2013-11-16 MED ORDER — ONDANSETRON HCL 4 MG PO TABS: 4.0000 mg | ORAL_TABLET | ORAL | Status: DC | PRN

## 2013-11-16 MED ORDER — WITCH HAZEL-GLYCERIN EX PADS: 1.0000 "application " | MEDICATED_PAD | CUTANEOUS | Status: DC | PRN

## 2013-11-16 MED ORDER — LANOLIN HYDROUS EX OINT: TOPICAL_OINTMENT | CUTANEOUS | Status: DC | PRN

## 2013-11-16 NOTE — Progress Notes (Signed)
Post Partum Day 0 Subjective: no complaints  Objective: Blood pressure 121/63, pulse 89, temperature 98.7 F (37.1 C), temperature source Oral, resp. rate 16, height  (1.702 m), weight 187 lb (84.823 kg), last menstrual period 02/16/2013, SpO2 99.00%, unknown if currently breastfeeding.  Physical Exam:  General: alert and no distress Lochia: appropriate Uterine Fundus: firm Incision: none DVT Evaluation: No evidence of DVT seen on physical exam.   Recent Labs  11/16/13 0044 11/16/13 0605  HGB 13.7 12.9  HCT 39.8 37.7    Assessment/Plan: Plan for discharge tomorrow   LOS: 1 day   Maddisyn Hegwood A 11/16/2013, 7:19 AM

## 2013-11-16 NOTE — H&P (Signed)
Kristen Grimes is a 23 y.o. female presenting for UC's. Maternal Medical History:  Reason for admission: Contractions.   Fetal activity: Perceived fetal activity is normal.   Last perceived fetal movement was within the past hour.    Prenatal Complications - Diabetes: none.    OB History   Grav Para Term Preterm Abortions TAB SAB Ect Mult Living   Past Medical History  Diagnosis Date  . Medical history non-contributory    Past Surgical History  Procedure Laterality Date  . No past surgeries     Family History: family history includes Multiple sclerosis in her mother. Social History:  reports that she has never smoked. She has never used smokeless tobacco. She reports that she does not drink alcohol or use illicit drugs.   Prenatal Transfer Tool  Maternal Diabetes: No Genetic Screening: Normal Maternal Ultrasounds/Referrals: Normal Fetal Ultrasounds or other Referrals:  None Maternal Substance Abuse:  No Significant Maternal Medications:  None Significant Maternal Lab Results:  None Other Comments:  None  ROS  Dilation: 6 Effacement (%): 90 Station: -1 Exam by:: Restaurant manager, [employer] Blood pressure 118/80, pulse 80, temperature 98 F (36.7 C), temperature source Oral, resp. rate 18, height  (1.702 m), weight 187 lb (84.823 kg), last menstrual period 02/16/2013. Exam Physical Exam  Nursing note and vitals reviewed.   Prenatal labs: ABO, Rh: B/POS/-- (03/31 1228) Antibody: NEG (03/31 1228) Rubella: 2.52 (03/31 1228) RPR: NON REAC (05/26 1400)  HBsAg: NEGATIVE (03/31 1228)  HIV: NONREACTIVE (05/26 1400)  GBS: NOT DETECTED (07/28 1114)   Assessment/Plan: 40.6 weeks.  Active labor.  Admit.   HARPER,CHARLES A 11/16/2013, 12:48 AM

## 2013-11-16 NOTE — Progress Notes (Signed)
UR chart review completed.  

## 2013-11-16 NOTE — Progress Notes (Signed)
Kristen Grimes is a 23 y.o. G2P1001 at [redacted]w[redacted]d by LMP admitted for active labor  Subjective:   Objective: BP 118/80  Pulse 80  Temp(Src) 98 F (36.7 C) (Oral)  Resp 18  Ht  (1.702 m)  Wt 187 lb (84.823 kg)  BMI 29.28 kg/m2  LMP 02/16/2013      FHT:  FHR: 130 bpm, variability: moderate,  accelerations:  Present,  decelerations:  Absent UC:   regular, every 3 minutes SVE:   Dilation: 9 Effacement (%): 100 Station: +1 Exam by:: Dan Europe, RNC  Labs: Lab Results  Component Value Date   WBC 5.5 11/15/2013   HGB 13.3 11/15/2013   HCT 38.2 11/15/2013   MCV 88.4 11/15/2013   PLT 199 11/15/2013    Assessment / Plan: Spontaneous labor, progressing normally  Labor: Progressing normally Preeclampsia:  n/a Fetal Wellbeing:  Category I Pain Control:  Labor support without medications I/D:  n/a Anticipated MOD:  NSVD  HARPER,CHARLES A 11/16/2013, 12:51 AM

## 2013-11-17 MED ORDER — MEDROXYPROGESTERONE ACETATE 150 MG/ML IM SUSP: 150.0000 mg | INTRAMUSCULAR | Status: DC

## 2013-11-17 NOTE — Discharge Instructions (Signed)

## 2013-11-17 NOTE — Discharge Summary (Signed)
  Obstetric Discharge Summary Reason for Admission: onset of labor Prenatal Procedures: none Intrapartum Procedures: spontaneous vaginal delivery Postpartum Procedures: none Complications-Operative and Postpartum: none  Hemoglobin  Date Value Ref Range Status  11/16/2013 12.9  12.0 - 15.0 g/dL Final     HCT  Date Value Ref Range Status  11/16/2013 37.7  36.0 - 46.0 % Final    Physical Exam:  General: alert Lochia: appropriate Uterine: firm Incision: n/a DVT Evaluation: No evidence of DVT seen on physical exam.  Discharge Diagnoses: Active Problems:   Active labor   Normal delivery   Discharge Information: Date: 11/17/2013 Activity: pelvic rest Diet: routine Medications:  Prior to Admission medications   Medication Sig Start Date End Date Taking? Authorizing Provider  acetaminophen (TYLENOL) 325 MG tablet Take 325 mg by mouth every 6 (six) hours as needed for mild pain or headache.    Yes Historical Provider, MD  Prenat w/o A-FeCbn-DSS-FA-DHA (CITRANATAL HARMONY) 27-1-250 MG CAPS Take 1 capsule by mouth daily before breakfast. 06/14/13  Yes Brock Bad, MD  medroxyPROGESTERone (DEPO-PROVERA) 150 MG/ML injection Inject 1 mL (150 mg total) into the muscle every 3 (three) months. 11/17/13   Antionette Char, MD    Condition: stable Instructions: refer to routine discharge instructions Discharge to: home Follow-up Information   Follow up with HARPER,CHARLES A, MD. Schedule an appointment as soon as possible for a visit in 2 weeks.   Specialty:  Obstetrics and Gynecology   Contact information:   [address] [place] [address] [phone]       Newborn Data:  Live born female  Birth Weight: 6 lb 15.1 oz (3150 g) APGAR: 9, 9   Home with mother.  JACKSON-MOORE,Syd Manges A 11/17/2013, 9:03 AM

## 2013-12-01 ENCOUNTER — Encounter: Payer: Self-pay | Admitting: Obstetrics

## 2013-12-01 ENCOUNTER — Ambulatory Visit (INDEPENDENT_AMBULATORY_CARE_PROVIDER_SITE_OTHER): Payer: Medicaid Other | Admitting: Obstetrics

## 2013-12-01 DIAGNOSIS — Z3009 Encounter for other general counseling and advice on contraception: Secondary | ICD-10-CM

## 2013-12-01 MED ORDER — MEDROXYPROGESTERONE ACETATE 150 MG/ML IM SUSP: 150.0000 mg | INTRAMUSCULAR | Status: DC

## 2013-12-01 NOTE — Progress Notes (Signed)
Subjective:     Kristen Grimes is a 23 y.o. female who presents for a postpartum visit. She is 2 weeks postpartum following a spontaneous vaginal delivery. I have fully reviewed the prenatal and intrapartum course. The delivery was at 40 gestational weeks. Outcome: spontaneous vaginal delivery. Anesthesia: none. Postpartum course has been normal. Baby's course has been normal. Baby is feeding by bottle Rush Barer. Bleeding thin lochia. Bowel function is normal. Bladder function is normal. Patient is not sexually active. Contraception method is abstinence. Postpartum depression screening: negative.  Tobacco, alcohol and substance abuse history reviewed.  Adult immunizations reviewed including TDAP, rubella and varicella.  The following portions of the patient's history were reviewed and updated as appropriate: allergies, current medications, past family history, past medical history, past social history, past surgical history and problem list.  Review of Systems A comprehensive review of systems was negative.   Objective:    BP 119/83  Pulse 97  Temp(Src) 97.4 F (36.3 C)  Ht  (1.727 m)  Wt 167 lb (75.751 kg)  BMI 25.40 kg/m2  LMP 02/16/2013  Breastfeeding? No   PE:  Deferred   100% of 10 min visit spent on counseling and coordination of care.   Assessment:   Postpartum.  2 weeks.  Doing well. Counseling for contraception.  Plan:    1. Contraception: Depo-Provera injections 2. Depo Provera Rx. 3. Follow up in: 6 weeks or as needed.   Healthy lifestyle practices reviewed

## 2013-12-29 ENCOUNTER — Ambulatory Visit: Payer: Medicaid Other | Admitting: Obstetrics

## 2014-01-16 ENCOUNTER — Encounter: Payer: Self-pay | Admitting: Obstetrics

## 2018-01-13 ENCOUNTER — Ambulatory Visit (INDEPENDENT_AMBULATORY_CARE_PROVIDER_SITE_OTHER): Payer: Medicaid Other | Admitting: Certified Nurse Midwife

## 2018-01-13 ENCOUNTER — Encounter: Payer: Self-pay | Admitting: Certified Nurse Midwife

## 2018-01-13 DIAGNOSIS — Z124 Encounter for screening for malignant neoplasm of cervix: Secondary | ICD-10-CM | POA: Diagnosis not present

## 2018-01-13 DIAGNOSIS — Z113 Encounter for screening for infections with a predominantly sexual mode of transmission: Secondary | ICD-10-CM

## 2018-01-13 DIAGNOSIS — Z3481 Encounter for supervision of other normal pregnancy, first trimester: Secondary | ICD-10-CM

## 2018-01-13 DIAGNOSIS — Z3480 Encounter for supervision of other normal pregnancy, unspecified trimester: Secondary | ICD-10-CM

## 2018-01-13 MED ORDER — VITAFOL ULTRA 29-0.6-0.4-200 MG PO CAPS: 1.0000 | ORAL_CAPSULE | Freq: Every day | ORAL | 8 refills | Status: DC

## 2018-01-13 NOTE — Progress Notes (Signed)
Subjective:   Kristen Grimes is a 27 y.o. G3P2002 at [redacted]w[redacted]d by LMP being seen today for her first obstetrical visit.  Her obstetrical history is significant for nothing. Patient does intend to breast feed. Pregnancy history fully reviewed.  Patient reports no complaints.  HISTORY: OB History  Gravida Para Term Preterm AB Living  3 2 2  0 0 2  SAB TAB Ectopic Multiple Live Births  0 0 0 0 2    # Outcome Date GA Lbr Len/2nd Weight Sex Delivery Anes PTL Lv  3 Current           2 Term 11/16/13 [redacted]w[redacted]d -21:07 / 00:01 6 lb 15.1 oz (3.15 kg) F Vag-Spont None  LIV     Name: TAKERA, RAYL     Apgar1: 9  Apgar5: 9  1 Term 07/31/10 [redacted]w[redacted]d   M Vag-Spont None N LIV     Name: CORDE,JOSHUA    Last pap smear was done 2016 and was normal per patient   Past Medical History:  Diagnosis Date  . Medical history non-contributory    Past Surgical History:  Procedure Laterality Date  . NO PAST SURGERIES     Family History  Problem Relation Age of Onset  . Multiple sclerosis Mother    Social History   Tobacco Use  . Smoking status: Never Smoker  . Smokeless tobacco: Never Used  Substance Use Topics  . Alcohol use: Yes    Comment: occ wine- stop with pregnancy  . Drug use: No   No Known Allergies Current Outpatient Medications on File Prior to Visit  Medication Sig Dispense Refill  . prenatal vitamin w/FE, FA (PRENATAL 1 + 1) 27-1 MG TABS tablet Take 1 tablet by mouth daily at 12 noon.     No current facility-administered medications on file prior to visit.     Review of Systems Pertinent items noted in HPI and remainder of comprehensive ROS otherwise negative.  Exam   Vitals:   01/13/18 1526  BP: 124/75  Pulse: (!) 108  Weight: 149 lb (67.6 kg)   Fetal Heart Rate (bpm): 165  Pelvic Exam: Perineum: no hemorrhoids, normal perineum   Vulva: normal external genitalia, no lesions   Vagina:  normal mucosa, normal discharge   Cervix: no lesions and normal, pap smear done.     Adnexa: normal adnexa and no mass, fullness, tenderness   Bony Pelvis: average  System: General: well-developed, well-nourished female in no acute distress   Breasts:  normal appearance, no masses or tenderness bilaterally   Skin: normal coloration and turgor, no rashes   Neurologic: oriented, normal, negative, normal mood   Extremities: normal strength, tone, and muscle mass, ROM of all joints is normal   HEENT PERRLA, extraocular movement intact and sclera clear   Mouth/Teeth mucous membranes moist, pharynx normal without lesions and dental hygiene good   Neck supple and no masses   Cardiovascular: regular rate and rhythm   Respiratory:  no respiratory distress, normal breath sounds   Abdomen: soft, non-tender; bowel sounds normal; no masses,  no organomegaly   Assessment:   Pregnancy: [id number] Patient Active Problem List   Diagnosis Date Noted  . Supervision of other normal pregnancy, antepartum 01/13/2018  . Normal delivery 11/16/2013  . Active labor 11/15/2013  . Seasonal rhinitis 06/18/2013     Plan:  1. Supervision of other normal pregnancy, antepartum - Patient doing well, no complaints  - Request prenatal vitamins, PNV samples given due to patient not  having insurance to cover PNV at this time  - Cytology - PAP( [place]) - Hemoglobinopathy evaluation - Culture, OB Urine - Cystic Fibrosis Mutation 97 - SMN1 Copy Number Analysis - Obstetric Panel, Including HIV - Prenat-Fe Poly-Methfol-FA-DHA (VITAFOL ULTRA) 29-0.6-0.4-200 MG CAPS; Take 1 tablet by mouth daily.  Dispense: 30 capsule; Refill: 8   Initial labs drawn. Continue prenatal vitamins. Genetic Screening discussed, NIPS: requested. - will obtain at next visit due to patient not having insurance and will have to pay out of pocket if completed today  Ultrasound discussed; fetal anatomic survey: requested. Problem list reviewed and updated. The nature of [facility] with  multiple MDs and other Advanced Practice Providers was explained to patient; also emphasized that residents, students are part of our team. Routine obstetric precautions reviewed. Return in about 4 weeks (around 02/10/2018) for ROB.    Sharyon Cable, CNM [facility] for [employer], [facility]

## 2018-01-14 LAB — CYTOLOGY - PAP
Chlamydia: NEGATIVE
Diagnosis: NEGATIVE
Neisseria Gonorrhea: NEGATIVE
Trichomonas: NEGATIVE

## 2018-01-16 LAB — CULTURE, OB URINE

## 2018-01-16 LAB — OBSTETRIC PANEL, INCLUDING HIV
Antibody Screen: NEGATIVE
Basophils Absolute: 0 10*3/uL (ref 0.0–0.2)
Basos: 0 %
EOS (ABSOLUTE): 0.1 10*3/uL (ref 0.0–0.4)
Eos: 1 %
HIV Screen 4th Generation wRfx: NONREACTIVE
Hematocrit: 29.7 % — ABNORMAL LOW (ref 34.0–46.6)
Hemoglobin: 10.3 g/dL — ABNORMAL LOW (ref 11.1–15.9)
Hepatitis B Surface Ag: NEGATIVE
Immature Grans (Abs): 0 10*3/uL (ref 0.0–0.1)
Immature Granulocytes: 0 %
Lymphocytes Absolute: 1.7 10*3/uL (ref 0.7–3.1)
Lymphs: 35 %
MCH: 31.3 pg (ref 26.6–33.0)
MCHC: 34.7 g/dL (ref 31.5–35.7)
MCV: 90 fL (ref 79–97)
Monocytes Absolute: 0.4 10*3/uL (ref 0.1–0.9)
Monocytes: 8 %
Neutrophils Absolute: 2.6 10*3/uL (ref 1.4–7.0)
Neutrophils: 56 %
Platelets: 107 10*3/uL — ABNORMAL LOW (ref 150–450)
RBC: 3.29 x10E6/uL — ABNORMAL LOW (ref 3.77–5.28)
RDW: 13.1 % (ref 12.3–15.4)
RPR Ser Ql: NONREACTIVE
Rh Factor: POSITIVE
Rubella Antibodies, IGG: 3.41 index (ref >0.99)
WBC: 4.7 10*3/uL (ref 3.4–10.8)

## 2018-01-16 LAB — HEMOGLOBINOPATHY EVALUATION
HGB C: 0 %
HGB S: 0 %
HGB VARIANT: 0 %
Hemoglobin A2 Quantitation: 2.5 % (ref 1.8–3.2)
Hemoglobin F Quantitation: 0 % (ref 0.0–2.0)
Hgb A: 97.5 % (ref 96.4–98.8)

## 2018-01-16 LAB — URINE CULTURE, OB REFLEX

## 2018-01-21 LAB — SMN1 COPY NUMBER ANALYSIS (SMA CARRIER SCREENING)

## 2018-01-21 LAB — CYSTIC FIBROSIS MUTATION 97: Interpretation: NOT DETECTED

## 2018-01-27 ENCOUNTER — Encounter: Payer: Self-pay | Admitting: Family Medicine

## 2018-01-27 DIAGNOSIS — O99019 Anemia complicating pregnancy, unspecified trimester: Secondary | ICD-10-CM

## 2018-01-27 HISTORY — DX: Anemia complicating pregnancy, unspecified trimester: O99.019

## 2018-02-18 ENCOUNTER — Encounter: Payer: Self-pay | Admitting: Certified Nurse Midwife

## 2018-02-24 ENCOUNTER — Ambulatory Visit (INDEPENDENT_AMBULATORY_CARE_PROVIDER_SITE_OTHER): Payer: Medicaid Other | Admitting: Certified Nurse Midwife

## 2018-02-24 DIAGNOSIS — Z3482 Encounter for supervision of other normal pregnancy, second trimester: Secondary | ICD-10-CM

## 2018-02-24 DIAGNOSIS — Z3480 Encounter for supervision of other normal pregnancy, unspecified trimester: Secondary | ICD-10-CM

## 2018-02-24 NOTE — Patient Instructions (Signed)

## 2018-02-24 NOTE — Progress Notes (Signed)
Pt is here for ROB visit. [id number] 495w1d.

## 2018-02-24 NOTE — Progress Notes (Signed)
   PRENATAL VISIT NOTE  Subjective:  Kristen Grimes is a 27 y.o. G3P2002 at 2271w1d being seen today for ongoing prenatal care.  She is currently monitored for the following issues for this low-risk pregnancy and has Seasonal rhinitis; Supervision of other normal pregnancy, antepartum; and Anemia affecting pregnancy, antepartum on their problem list.  Patient reports no complaints.  Contractions: Not present. Vag. Bleeding: None.  Movement: Present. Denies leaking of fluid.   The following portions of the patient's history were reviewed and updated as appropriate: allergies, current medications, past family history, past medical history, past social history, past surgical history and problem list. Problem list updated.  Objective:   Vitals:   02/24/18 1524  BP: 112/65  Pulse: 79  Weight: 159 lb 6.4 oz (72.3 kg)    Fetal Status: Fetal Heart Rate (bpm): 160   Movement: Present     General:  Alert, oriented and cooperative. Patient is in no acute distress.  Skin: Skin is warm and dry. No rash noted.   Cardiovascular: Normal heart rate noted  Respiratory: Normal respiratory effort, no problems with respiration noted  Abdomen: Soft, gravid, appropriate for gestational age.  Pain/Pressure: Absent     Pelvic: Cervical exam deferred        Extremities: Normal range of motion.  Edema: None  Mental Status: Normal mood and affect. Normal behavior. Normal judgment and thought content.   Assessment and Plan:  Pregnancy: G3P2002 at 5871w1d  1. Supervision of other normal pregnancy, antepartum - Patient doing well, no complaints - Anticipatory guidance on upcoming appointments  - Anatomy US scheduled  - Results of NOB reviewed  - US MFM OB COMP + 14 WK; Future  Preterm labor symptoms and general obstetric precautions including but not limited to vaginal bleeding, contractions, leaking of fluid and fetal movement were reviewed in detail with the patient. Please refer to After Visit Summary for  other counseling recommendations.  Return in about 4 weeks (around 03/24/2018) for ROB.  Future Appointments  Date Time Provider Department Center  03/04/2018  1:45 PM [facility] US 5 [facility]  03/25/2018  3:30 PM Sharyon Cableogers, Cannie Muckle C, CNM [facility] None    Sharyon CableVeronica C Breezie Micucci, CNM

## 2018-03-04 ENCOUNTER — Ambulatory Visit (HOSPITAL_COMMUNITY)
Admission: RE | Admit: 2018-03-04 | Discharge: 2018-03-04 | Disposition: A | Discharge status: Home / Self Care | Payer: Medicaid Other | Source: Ambulatory Visit

## 2018-03-04 DIAGNOSIS — Z3A18 18 weeks gestation of pregnancy: Secondary | ICD-10-CM

## 2018-03-04 DIAGNOSIS — Z363 Encounter for antenatal screening for malformations: Secondary | ICD-10-CM | POA: Diagnosis present

## 2018-03-04 DIAGNOSIS — Z3480 Encounter for supervision of other normal pregnancy, unspecified trimester: Secondary | ICD-10-CM

## 2018-03-17 NOTE — L&D Delivery Note (Signed)
OB/GYN Faculty Practice Delivery Note  Mayona Yount is a 28 y.o. [id number] s/p SVD at [redacted]w[redacted]d. She was admitted for spontaneous onset of labor, SROM.   ROM: 8h 34m with meconium-stained fluid GBS Status: positive - inadequately treated (one dose of ampicillin) Maximum Maternal Temperature: Temp (24hrs), Avg:98.4 F (36.9 C), Min:98 F (36.7 C), Max:98.9 F (37.2 C)  Labor Progress: . Admitted in active labor . Progressed to complete without augmentation   Delivery Date/Time: 07/25/18 at 1217 Delivery: Called to room and patient was complete and pushing. Head delivered LOA. No nuchal cord present. Shoulder and body delivered in usual fashion. Infant with spontaneous cry, placed on mother's abdomen, dried and stimulated. Cord clamped x 2 after 1-minute delay, and cut by father of baby. Cord blood drawn. Placenta delivered spontaneously with gentle cord traction. Fundus firm with massage and Pitocin. Labia, perineum, vagina, and cervix inspected inspected with no lacerations.   Placenta: spontaneous, intact, 3-vessel cord (to be discarded) Complications: none Lacerations: none EBL: 308cc per triton Analgesia: none  Postpartum Planning [x]  message to sent to schedule follow-up  [x]  vaccines UTD  Infant: Vigorous female  APGARs 9, 9  86g (6lb 13oz)  Eddie Payette S. Earlene Plater, DO OB/GYN Fellow, Faculty Practice

## 2018-03-25 ENCOUNTER — Ambulatory Visit (INDEPENDENT_AMBULATORY_CARE_PROVIDER_SITE_OTHER): Payer: Medicaid Other | Admitting: Certified Nurse Midwife

## 2018-03-25 ENCOUNTER — Encounter: Payer: Self-pay | Admitting: Certified Nurse Midwife

## 2018-03-25 VITALS — BP 121/67 | HR 78 | Wt 164.0 lb

## 2018-03-25 DIAGNOSIS — Z3482 Encounter for supervision of other normal pregnancy, second trimester: Secondary | ICD-10-CM

## 2018-03-25 DIAGNOSIS — Z3480 Encounter for supervision of other normal pregnancy, unspecified trimester: Secondary | ICD-10-CM

## 2018-03-25 DIAGNOSIS — Z3A21 21 weeks gestation of pregnancy: Secondary | ICD-10-CM

## 2018-03-25 MED ORDER — VITAFOL ULTRA 29-0.6-0.4-200 MG PO CAPS: 1.0000 | ORAL_CAPSULE | Freq: Every day | ORAL | 8 refills | Status: DC

## 2018-03-25 NOTE — Patient Instructions (Signed)

## 2018-03-25 NOTE — Progress Notes (Signed)
   PRENATAL VISIT NOTE  Subjective:  Kristen Grimes is a 28 y.o. G3P2002 at [redacted]w[redacted]d being seen today for ongoing prenatal care.  She is currently monitored for the following issues for this low-risk pregnancy and has Seasonal rhinitis; Supervision of other normal pregnancy, antepartum; and Anemia affecting pregnancy, antepartum on their problem list.  Patient reports no complaints.  Contractions: Not present. Vag. Bleeding: None.  Movement: Present. Denies leaking of fluid.   The following portions of the patient's history were reviewed and updated as appropriate: allergies, current medications, past family history, past medical history, past social history, past surgical history and problem list. Problem list updated.  Objective:  Vitals:   03/25/18 1554  BP: 121/67  Pulse: 78  Weight: 164 lb (74.4 kg)    Fetal Status: Fetal Heart Rate (bpm): 158 Fundal Height: 21 cm Movement: Present     General:  Alert, oriented and cooperative. Patient is in no acute distress.  Skin: Skin is warm and dry. No rash noted.   Cardiovascular: Normal heart rate noted  Respiratory: Normal respiratory effort, no problems with respiration noted  Abdomen: Soft, gravid, appropriate for gestational age.  Pain/Pressure: Absent     Pelvic: Cervical exam deferred        Extremities: Normal range of motion.  Edema: None  Mental Status: Normal mood and affect. Normal behavior. Normal judgment and thought content.   Assessment and Plan:  Pregnancy: G3P2002 at [redacted]w[redacted]d  1. Supervision of other normal pregnancy, antepartum - Patient doing well, no complaints - Routine prenatal care  - Anticipatory guidance on upcoming appointments  - Reviewed results of NOB labs and anatomy US - Prenat-Fe Poly-Methfol-FA-DHA (VITAFOL ULTRA) 29-0.6-0.4-200 MG CAPS; Take 1 tablet by mouth daily.  Dispense: 30 capsule; Refill: 8  Preterm labor symptoms and general obstetric precautions including but not limited to vaginal bleeding,  contractions, leaking of fluid and fetal movement were reviewed in detail with the patient. Please refer to After Visit Summary for other counseling recommendations.  Return in about 4 weeks (around 04/22/2018) for ROB.  Future Appointments  Date Time Provider Department Center  04/22/2018  3:15 PM Sharyon Cable, CNM [facility] None    Sharyon Cable, [place]

## 2018-03-25 NOTE — Progress Notes (Signed)
Need refill on PNV's

## 2018-04-22 ENCOUNTER — Encounter: Payer: Medicaid Other | Admitting: Certified Nurse Midwife

## 2018-04-27 ENCOUNTER — Encounter: Payer: Self-pay | Admitting: Advanced Practice Midwife

## 2018-04-27 ENCOUNTER — Other Ambulatory Visit (HOSPITAL_COMMUNITY)
Admission: RE | Admit: 2018-04-27 | Discharge: 2018-04-27 | Disposition: A | Discharge status: Home / Self Care | Payer: Medicaid Other | Source: Ambulatory Visit | Attending: Advanced Practice Midwife | Admitting: Advanced Practice Midwife

## 2018-04-27 ENCOUNTER — Ambulatory Visit (INDEPENDENT_AMBULATORY_CARE_PROVIDER_SITE_OTHER): Payer: Medicaid Other | Admitting: Advanced Practice Midwife

## 2018-04-27 VITALS — BP 123/68 | HR 98 | Wt 178.0 lb

## 2018-04-27 DIAGNOSIS — Z3480 Encounter for supervision of other normal pregnancy, unspecified trimester: Secondary | ICD-10-CM

## 2018-04-27 DIAGNOSIS — N898 Other specified noninflammatory disorders of vagina: Secondary | ICD-10-CM | POA: Insufficient documentation

## 2018-04-27 DIAGNOSIS — Z3482 Encounter for supervision of other normal pregnancy, second trimester: Secondary | ICD-10-CM

## 2018-04-27 MED ORDER — VITAFOL ULTRA 29-0.6-0.4-200 MG PO CAPS: 1.0000 | ORAL_CAPSULE | Freq: Every day | ORAL | 12 refills | Status: DC

## 2018-04-27 NOTE — Progress Notes (Signed)
   PRENATAL VISIT NOTE  Subjective:  Kristen Grimes is a 28 y.o. G3P2002 at 3048w0d being seen today for ongoing prenatal care.  She is currently monitored for the following issues for this low-risk pregnancy and has Seasonal rhinitis; Supervision of other normal pregnancy, antepartum; and Anemia affecting pregnancy, antepartum on their problem list.  Patient reports spotting two days ago after intercourse and vaginal discharge.Marland Kitchen. No previa per anatomy US Contractions: Not present. Vag. Bleeding: None, Scant.  Movement: Present. Denies leaking of fluid.   The following portions of the patient's history were reviewed and updated as appropriate: allergies, current medications, past family history, past medical history, past social history, past surgical history and problem list. Problem list updated.  Objective:   Vitals:   04/27/18 1505  BP: 123/68  Pulse: 98  Weight: 178 lb (80.7 kg)    Fetal Status: Fetal Heart Rate (bpm): 150 Fundal Height: 27 cm Movement: Present     General:  Alert, oriented and cooperative. Patient is in no acute distress.  Skin: Skin is warm and dry. No rash noted.   Cardiovascular: Normal heart rate noted  Respiratory: Normal respiratory effort, no problems with respiration noted  Abdomen: Soft, gravid, appropriate for gestational age.  Pain/Pressure: Absent     Pelvic: Cervical exam performed Dilation: Closed Effacement (%): 0 Station: Ballotable. No vaginal bleeding, moderate, thin white, odorless discharge  Extremities: Normal range of motion.  Edema: None  Mental Status: Normal mood and affect. Normal behavior. Normal judgment and thought content.   Assessment and Plan:  Pregnancy: G3P2002 at 7648w0d  1. Supervision of other normal pregnancy, antepartum - GTT at NV - TDaP info given  Preterm labor symptoms and general obstetric precautions including but not limited to vaginal bleeding, contractions, leaking of fluid and fetal movement were reviewed in  detail with the patient. Please refer to After Visit Summary for other counseling recommendations.  Return in about 2 weeks (around 05/11/2018) for ROB/GTT.  No future appointments.  Dorathy KinsmanVirginia Shenekia Riess, CNM

## 2018-04-27 NOTE — Patient Instructions (Signed)
TDaP Vaccine Pregnancy Get the Whooping Cough Vaccine While You Are Pregnant (CDC)  It is important for women to get the whooping cough vaccine in the third trimester of each pregnancy. Vaccines are the best way to prevent this disease. There are 2 different whooping cough vaccines. Both vaccines combine protection against whooping cough, tetanus and diphtheria, but they are for different age groups: Tdap: for everyone 11 years or older, including pregnant women  DTaP: for children 2 months through 6 years of age  You need the whooping cough vaccine during each of your pregnancies The recommended time to get the shot is during your 27th through 36th week of pregnancy, preferably during the earlier part of this time period. The Centers for Disease Control and Prevention (CDC) recommends that pregnant women receive the whooping cough vaccine for adolescents and adults (called Tdap vaccine) during the third trimester of each pregnancy. The recommended time to get the shot is during your 27th through 36th week of pregnancy, preferably during the earlier part of this time period. This replaces the original recommendation that pregnant women get the vaccine only if they had not previously received it. The American College of Obstetricians and Gynecologists and the American College of Nurse-Midwives support this recommendation.  You should get the whooping cough vaccine while pregnant to pass protection to your baby frame support disabled and/or not supported in this browser  Learn why Laura decided to get the whooping cough vaccine in her 3rd trimester of pregnancy and how her baby girl was born with some protection against the disease. Also available on YouTube. After receiving the whooping cough vaccine, your body will create protective antibodies (proteins produced by the body to fight off diseases) and pass some of them to your baby before birth. These antibodies provide your baby some [place]er-term  protection against whooping cough in early life. These antibodies can also protect your baby from some of the more serious complications that come along with whooping cough. Your protective antibodies are at their highest about 2 weeks after getting the vaccine, but it takes time to pass them to your baby. So the preferred time to get the whooping cough vaccine is early in your third trimester. The amount of whooping cough antibodies in your body decreases over time. That is why CDC recommends you get a whooping cough vaccine during each pregnancy. Doing so allows each of your babies to get the greatest number of protective antibodies from you. This means each of your babies will get the best protection possible against this disease.  Getting the whooping cough vaccine while pregnant is better than getting the vaccine after you give birth Whooping cough vaccination during pregnancy is ideal so your baby will have Mc[place]-term protection as soon as he is born. This early protection is important because your baby will not start getting his whooping cough vaccines until he is 2 months old. These first few months of life are when your baby is at greatest risk for catching whooping cough. This is also when he's at greatest risk for having severe, potentially life-threating complications from the infection. To avoid that gap in protection, it is best to get a whooping cough vaccine during pregnancy. You will then pass protection to your baby before he is born. To continue protecting your baby, he should get whooping cough vaccines starting at 2 months old. You may never have gotten the Tdap vaccine before and did not get it during this pregnancy. If so, you should make sure   to get the vaccine immediately after you give birth, before leaving the hospital or birthing center. It will take about 2 weeks before your body develops protection (antibodies) in response to the vaccine. Once you have protection from the vaccine,  you are less likely to give whooping cough to your newborn while caring for him. But remember, your baby will still be at risk for catching whooping cough from others. A recent study looked to see how effective Tdap was at preventing whooping cough in babies whose mothers got the vaccine while pregnant or in the hospital after giving birth. The study found that getting Tdap between 27 through 36 weeks of pregnancy is 85% more effective at preventing whooping cough in babies younger than 2 months old. Blood tests cannot tell if you need a whooping cough vaccine There are no blood tests that can tell you if you have enough antibodies in your body to protect yourself or your baby against whooping cough. Even if you have been sick with whooping cough in the past or previously received the vaccine, you still should get the vaccine during each pregnancy. Breastfeeding may pass some protective antibodies onto your baby By breastfeeding, you may pass some antibodies you have made in response to the vaccine to your baby. When you get a whooping cough vaccine during your pregnancy, you will have antibodies in your breast milk that you can share with your baby as soon as your milk comes in. However, your baby will not get protective antibodies immediately if you wait to get the whooping cough vaccine until after delivering your baby. This is because it takes about 2 weeks for your body to create antibodies. Learn more about the health benefits of breastfeeding.   Preterm Labor and Birth Information  The normal length of a pregnancy is 39-41 weeks. Preterm labor is when labor starts before 37 completed weeks of pregnancy. What are the risk factors for preterm labor? Preterm labor is more likely to occur in women who:  Have certain infections during pregnancy such as a bladder infection, sexually transmitted infection, or infection inside the uterus (chorioamnionitis).  Have a shorter-than-normal cervix.  Have  gone into preterm labor before.  Have had surgery on their cervix.  Are younger than age 55 or older than age 54.  Are African American.  Are pregnant with twins or multiple babies (multiple gestation).  Take street drugs or smoke while pregnant.  Do not gain enough weight while pregnant.  Became pregnant shortly after having been pregnant. What are the symptoms of preterm labor? Symptoms of preterm labor include:  Cramps similar to those that can happen during a menstrual period. The cramps may happen with diarrhea.  Pain in the abdomen or lower back.  Regular uterine contractions that may feel like tightening of the abdomen.  A feeling of increased pressure in the pelvis.  Increased watery or bloody mucus discharge from the vagina.  Water breaking (ruptured amniotic sac). Why is it important to recognize signs of preterm labor? It is important to recognize signs of preterm labor because babies who are born prematurely may not be fully developed. This can put them at an increased risk for:  Long-term (chronic) heart and lung problems.  Difficulty immediately after birth with regulating body systems, including blood sugar, body temperature, heart rate, and breathing rate.  Bleeding in the brain.  Cerebral palsy.  Learning difficulties.  Death. These risks are highest for babies who are born before 6 weeks of pregnancy. How  is preterm labor treated? Treatment depends on the length of your pregnancy, your condition, and the health of your baby. It may involve:  Having a stitch (suture) placed in your cervix to prevent your cervix from opening too early (cerclage).  Taking or being given medicines, such as: ? Hormone medicines. These may be given early in pregnancy to help support the pregnancy. ? Medicine to stop contractions. ? Medicines to help mature the baby's lungs. These may be prescribed if the risk of delivery is high. ? Medicines to prevent your baby from  developing cerebral palsy. If the labor happens before 34 weeks of pregnancy, you may need to stay in the hospital. What should I do if I think I am in preterm labor? If you think that you are going into preterm labor, call your health care provider right away. How can I prevent preterm labor in future pregnancies? To increase your chance of having a full-term pregnancy:  Do not use any tobacco products, such as cigarettes, chewing tobacco, and e-cigarettes. If you need help quitting, ask your health care provider.  Do not use street drugs or medicines that have not been prescribed to you during your pregnancy.  Talk with your health care provider before taking any herbal supplements, even if you have been taking them regularly.  Make sure you gain a healthy amount of weight during your pregnancy.  Watch for infection. If you think that you might have an infection, get it checked right away.  Make sure to tell your health care provider if you have gone into preterm labor before. This information is not intended to replace advice given to you by your health care provider. Make sure you discuss any questions you have with your health care provider. Document Released: 05/24/2003 Document Revised: 08/14/2015 Document Reviewed: 07/25/2015 Elsevier Interactive Patient Education  2019 [employer]

## 2018-04-27 NOTE — Addendum Note (Signed)
Addended by: Kennon Portela on: 04/27/2018 04:38 PM   Modules accepted: Orders

## 2018-04-27 NOTE — Progress Notes (Signed)
Pt states she was spotting 2 days ago (pink) around 3 am. Pt noted she had intercourse , but no spotting since episode. . Notes possible yeast infection Needs refill on PNV's.

## 2018-04-28 LAB — CERVICOVAGINAL ANCILLARY ONLY
Bacterial vaginitis: POSITIVE — AB
CANDIDA VAGINITIS: NEGATIVE
CHLAMYDIA, DNA PROBE: NEGATIVE
Neisseria Gonorrhea: NEGATIVE
Trichomonas: NEGATIVE

## 2018-05-04 MED ORDER — METRONIDAZOLE 500 MG PO TABS: 500.0000 mg | ORAL_TABLET | Freq: Two times a day (BID) | ORAL | 0 refills | Status: DC

## 2018-05-04 NOTE — Addendum Note (Signed)
Addended by: Dorathy Kinsman on: 05/04/2018 01:24 PM   Modules accepted: Orders

## 2018-05-13 ENCOUNTER — Encounter: Payer: Self-pay | Admitting: Certified Nurse Midwife

## 2018-05-13 ENCOUNTER — Other Ambulatory Visit: Payer: Medicaid Other

## 2018-05-13 ENCOUNTER — Ambulatory Visit (INDEPENDENT_AMBULATORY_CARE_PROVIDER_SITE_OTHER): Payer: Medicaid Other | Admitting: Certified Nurse Midwife

## 2018-05-13 VITALS — BP 104/67 | HR 90 | Wt 179.4 lb

## 2018-05-13 DIAGNOSIS — Z3483 Encounter for supervision of other normal pregnancy, third trimester: Secondary | ICD-10-CM

## 2018-05-13 DIAGNOSIS — Z3A28 28 weeks gestation of pregnancy: Secondary | ICD-10-CM

## 2018-05-13 DIAGNOSIS — Z3480 Encounter for supervision of other normal pregnancy, unspecified trimester: Secondary | ICD-10-CM

## 2018-05-13 DIAGNOSIS — Z23 Encounter for immunization: Secondary | ICD-10-CM | POA: Diagnosis not present

## 2018-05-13 NOTE — Patient Instructions (Signed)

## 2018-05-13 NOTE — Progress Notes (Signed)
Pt is here for ROB/2hr GTT. [redacted]w[redacted]d

## 2018-05-13 NOTE — Progress Notes (Signed)
   PRENATAL VISIT NOTE  Subjective:  Kristen Grimes is a 28 y.o. G3P2002 at [redacted]w[redacted]d being seen today for ongoing prenatal care.  She is currently monitored for the following issues for this low-risk pregnancy and has Seasonal rhinitis; Supervision of other normal pregnancy, antepartum; and Anemia affecting pregnancy, antepartum on their problem list.  Patient reports no complaints.  Contractions: Not present. Vag. Bleeding: None.  Movement: Present. Denies leaking of fluid.   The following portions of the patient's history were reviewed and updated as appropriate: allergies, current medications, past family history, past medical history, past social history, past surgical history and problem list. Problem list updated.  Objective:   Vitals:   05/13/18 0845  BP: 104/67  Pulse: 90  Weight: 179 lb 6.4 oz (81.4 kg)    Fetal Status: Fetal Heart Rate (bpm): 145 Fundal Height: 29 cm Movement: Present     General:  Alert, oriented and cooperative. Patient is in no acute distress.  Skin: Skin is warm and dry. No rash noted.   Cardiovascular: Normal heart rate noted  Respiratory: Normal respiratory effort, no problems with respiration noted  Abdomen: Soft, gravid, appropriate for gestational age.  Pain/Pressure: Absent     Pelvic: Cervical exam deferred        Extremities: Normal range of motion.  Edema: None  Mental Status: Normal mood and affect. Normal behavior. Normal judgment and thought content.   Assessment and Plan:  Pregnancy: G3P2002 at [redacted]w[redacted]d  1. Supervision of other normal pregnancy, antepartum - Patient doing well, no complaints - Anticipatory guidance on upcoming appointments  - Educated on GTT and what occurs if results are abnormal, answered patient questions - CBC - Glucose Tolerance, 2 Hours w/1 Hour - RPR - HIV Antibody (routine testing w rflx) - Tdap vaccine greater than or equal to 7yo IM  Preterm labor symptoms and general obstetric precautions including but not  limited to vaginal bleeding, contractions, leaking of fluid and fetal movement were reviewed in detail with the patient. Please refer to After Visit Summary for other counseling recommendations.  Return in about 2 weeks (around 05/27/2018) for ROB.  Future Appointments  Date Time Provider Department Center  05/24/2018  3:15 PM Leftwich-Kirby, Wilmer Floor, CNM [facility] None    Sharyon Cable, CNM

## 2018-05-14 LAB — CBC
Hematocrit: 33.1 % — ABNORMAL LOW (ref 34.0–46.6)
Hemoglobin: 11.6 g/dL (ref 11.1–15.9)
MCH: 32 pg (ref 26.6–33.0)
MCHC: 35 g/dL (ref 31.5–35.7)
MCV: 91 fL (ref 79–97)
Platelets: 245 10*3/uL (ref 150–450)
RBC: 3.63 x10E6/uL — ABNORMAL LOW (ref 3.77–5.28)
RDW: 12.8 % (ref 11.7–15.4)
WBC: 5.9 10*3/uL (ref 3.4–10.8)

## 2018-05-14 LAB — GLUCOSE TOLERANCE, 2 HOURS W/ 1HR
GLUCOSE, FASTING: 73 mg/dL (ref 65–91)
Glucose, 1 hour: 90 mg/dL (ref 65–179)
Glucose, 2 hour: 85 mg/dL (ref 65–152)

## 2018-05-14 LAB — RPR: RPR: NONREACTIVE

## 2018-05-14 LAB — HIV ANTIBODY (ROUTINE TESTING W REFLEX): HIV SCREEN 4TH GENERATION: NONREACTIVE

## 2018-05-24 ENCOUNTER — Ambulatory Visit (INDEPENDENT_AMBULATORY_CARE_PROVIDER_SITE_OTHER): Payer: Medicaid Other | Admitting: Advanced Practice Midwife

## 2018-05-24 VITALS — BP 118/65 | HR 80 | Wt 183.8 lb

## 2018-05-24 DIAGNOSIS — Z3480 Encounter for supervision of other normal pregnancy, unspecified trimester: Secondary | ICD-10-CM

## 2018-05-24 DIAGNOSIS — Z3A29 29 weeks gestation of pregnancy: Secondary | ICD-10-CM

## 2018-05-24 DIAGNOSIS — Z3483 Encounter for supervision of other normal pregnancy, third trimester: Secondary | ICD-10-CM

## 2018-05-24 NOTE — Progress Notes (Signed)
   PRENATAL VISIT NOTE  Subjective:  Kristen Grimes is a 28 y.o. G3P2002 at [redacted]w[redacted]d being seen today for ongoing prenatal care.  She is currently monitored for the following issues for this low-risk pregnancy and has Seasonal rhinitis; Supervision of other normal pregnancy, antepartum; and Anemia affecting pregnancy, antepartum on their problem list.  Patient reports no complaints.  Contractions: Not present. Vag. Bleeding: None.  Movement: Present. Denies leaking of fluid.   The following portions of the patient's history were reviewed and updated as appropriate: allergies, current medications, past family history, past medical history, past social history, past surgical history and problem list.   Objective:   Vitals:   05/24/18 1529  BP: 118/65  Pulse: 80  Weight: 83.4 kg    Fetal Status:     Movement: Present     General:  Alert, oriented and cooperative. Patient is in no acute distress.  Skin: Skin is warm and dry. No rash noted.   Cardiovascular: Normal heart rate noted  Respiratory: Normal respiratory effort, no problems with respiration noted  Abdomen: Soft, gravid, appropriate for gestational age.  Pain/Pressure: Absent     Pelvic: Cervical exam deferred        Extremities: Normal range of motion.  Edema: None  Mental Status: Normal mood and affect. Normal behavior. Normal judgment and thought content.   Assessment and Plan:  Pregnancy: G3P2002 at [redacted]w[redacted]d 1. Supervision of other normal pregnancy, antepartum --Anticipatory guidance about next visits/weeks of pregnancy given. --Reviewed normal 28 week labs.  Questions answered.  Preterm labor symptoms and general obstetric precautions including but not limited to vaginal bleeding, contractions, leaking of fluid and fetal movement were reviewed in detail with the patient. Please refer to After Visit Summary for other counseling recommendations.   Return in about 2 weeks (around 06/07/2018).  No future appointments.  [name], CNM

## 2018-05-24 NOTE — Patient Instructions (Signed)
Third Trimester of Pregnancy The third trimester is from week 28 through week 40 (months 7 through 9). The third trimester is a time when the unborn baby (fetus) is growing rapidly. At the end of the ninth month, the fetus is about 20 inches in length and weighs 6-10 pounds. Body changes during your third trimester Your body will continue to go through many changes during pregnancy. The changes vary from woman to woman. During the third trimester:  Your weight will continue to increase. You can expect to gain 25-35 pounds (11-16 kg) by the end of the pregnancy.  You may begin to get stretch marks on your hips, abdomen, and breasts.  You may urinate more often because the fetus is moving lower into your pelvis and pressing on your bladder.  You may develop or continue to have heartburn. This is caused by increased hormones that slow down muscles in the digestive tract.  You may develop or continue to have constipation because increased hormones slow digestion and cause the muscles that push waste through your intestines to relax.  You may develop hemorrhoids. These are swollen veins (varicose veins) in the rectum that can itch or be painful.  You may develop swollen, bulging veins (varicose veins) in your legs.  You may have increased body aches in the pelvis, back, or thighs. This is due to weight gain and increased hormones that are relaxing your joints.  You may have changes in your hair. These can include thickening of your hair, rapid growth, and changes in texture. Some women also have hair loss during or after pregnancy, or hair that feels dry or thin. Your hair will most likely return to normal after your baby is born.  Your breasts will continue to grow and they will continue to become tender. A yellow fluid (colostrum) may leak from your breasts. This is the first milk you are producing for your baby.  Your belly button may stick out.  You may notice more swelling in your hands,  face, or ankles.  You may have increased tingling or numbness in your hands, arms, and legs. The skin on your belly may also feel numb.  You may feel [place] of breath because of your expanding uterus.  You may have more problems sleeping. This can be caused by the size of your belly, increased need to urinate, and an increase in your body's metabolism.  You may notice the fetus "dropping," or moving lower in your abdomen (lightening).  You may have increased vaginal discharge.  You may notice your joints feel loose and you may have pain around your pelvic bone. What to expect at prenatal visits You will have prenatal exams every 2 weeks until week 36. Then you will have weekly prenatal exams. During a routine prenatal visit:  You will be weighed to make sure you and the baby are growing normally.  Your blood pressure will be taken.  Your abdomen will be measured to track your baby's growth.  The fetal heartbeat will be listened to.  Any test results from the previous visit will be discussed.  You may have a cervical check near your due date to see if your cervix has softened or thinned (effaced).  You will be tested for Group B streptococcus. This happens between 35 and 37 weeks. Your health care provider may ask you:  What your birth plan is.  How you are feeling.  If you are feeling the baby move.  If you have had any abnormal   symptoms, such as leaking fluid, bleeding, severe headaches, or abdominal cramping.  If you are using any tobacco products, including cigarettes, chewing tobacco, and electronic cigarettes.  If you have any questions. Other tests or screenings that may be performed during your third trimester include:  Blood tests that check for low iron levels (anemia).  Fetal testing to check the health, activity level, and growth of the fetus. Testing is done if you have certain medical conditions or if there are problems during the pregnancy.  Nonstress test  (NST). This test checks the health of your baby to make sure there are no signs of problems, such as the baby not getting enough oxygen. During this test, a belt is placed around your belly. The baby is made to move, and its heart rate is monitored during movement. What is false labor? False labor is a condition in which you feel small, irregular tightenings of the muscles in the womb (contractions) that usually go away with rest, changing position, or drinking water. These are called Braxton Hicks contractions. Contractions may last for hours, days, or even weeks before true labor sets in. If contractions come at regular intervals, become more frequent, increase in intensity, or become painful, you should see your health care provider. What are the signs of labor?  Abdominal cramps.  Regular contractions that start at 10 minutes apart and become stronger and more frequent with time.  Contractions that start on the top of the uterus and spread down to the lower abdomen and back.  Increased pelvic pressure and dull back pain.  A watery or bloody mucus discharge that comes from the vagina.  Leaking of amniotic fluid. This is also known as your "water breaking." It could be a slow trickle or a gush. Let your health care provider know if it has a color or strange odor. If you have any of these signs, call your health care provider right away, even if it is before your due date. Follow these instructions at home: Medicines  Follow your health care provider's instructions regarding medicine use. Specific medicines may be either safe or unsafe to take during pregnancy.  Take a prenatal vitamin that contains at least 600 micrograms (mcg) of folic acid.  If you develop constipation, try taking a stool softener if your health care provider approves. Eating and drinking   Eat a balanced diet that includes fresh fruits and vegetables, whole grains, good sources of protein such as meat, eggs, or tofu,  and low-fat dairy. Your health care provider will help you determine the amount of weight gain that is right for you.  Avoid raw meat and uncooked cheese. These carry germs that can cause birth defects in the baby.  If you have low calcium intake from food, talk to your health care provider about whether you should take a daily calcium supplement.  Eat four or five small meals rather than three large meals a day.  Limit foods that are high in fat and processed sugars, such as fried and sweet foods.  To prevent constipation: ? Drink enough fluid to keep your urine clear or pale yellow. ? Eat foods that are high in fiber, such as fresh fruits and vegetables, whole grains, and beans. Activity  Exercise only as directed by your health care provider. Most women can continue their usual exercise routine during pregnancy. Try to exercise for 30 minutes at least 5 days a week. Stop exercising if you experience uterine contractions.  Avoid heavy lifting.  Do   not exercise in extreme heat or humidity, or at high altitudes.  Wear low-heel, comfortable shoes.  Practice good posture.  You may continue to have sex unless your health care provider tells you otherwise. Relieving pain and discomfort  Take frequent breaks and rest with your legs elevated if you have leg cramps or low back pain.  Take warm sitz baths to soothe any pain or discomfort caused by hemorrhoids. Use hemorrhoid cream if your health care provider approves.  Wear a good support bra to prevent discomfort from breast tenderness.  If you develop varicose veins: ? Wear support pantyhose or compression stockings as told by your healthcare provider. ? Elevate your feet for 15 minutes, 3-4 times a day. Prenatal care  Write down your questions. Take them to your prenatal visits.  Keep all your prenatal visits as told by your health care provider. This is important. Safety  Wear your seat belt at all times when driving.  Make  a list of emergency phone numbers, including numbers for family, friends, the hospital, and police and fire departments. General instructions  Avoid cat litter boxes and soil used by cats. These carry germs that can cause birth defects in the baby. If you have a cat, ask someone to clean the litter box for you.  Do not travel far distances unless it is absolutely necessary and only with the approval of your health care provider.  Do not use hot tubs, steam rooms, or saunas.  Do not drink alcohol.  Do not use any products that contain nicotine or tobacco, such as cigarettes and e-cigarettes. If you need help quitting, ask your health care provider.  Do not use any medicinal herbs or unprescribed drugs. These chemicals affect the formation and growth of the baby.  Do not douche or use tampons or scented sanitary pads.  Do not cross your legs for long periods of time.  To prepare for the arrival of your baby: ? Take prenatal classes to understand, practice, and ask questions about labor and delivery. ? Make a trial run to the hospital. ? Visit the hospital and tour the maternity area. ? Arrange for maternity or paternity leave through employers. ? Arrange for family and friends to take care of pets while you are in the hospital. ? Purchase a rear-facing car seat and make sure you know how to install it in your car. ? Pack your hospital bag. ? Prepare the baby's nursery. Make sure to remove all pillows and stuffed animals from the baby's crib to prevent suffocation.  Visit your dentist if you have not gone during your pregnancy. Use a soft toothbrush to brush your teeth and be gentle when you floss. Contact a health care provider if:  You are unsure if you are in labor or if your water has broken.  You become dizzy.  You have mild pelvic cramps, pelvic pressure, or nagging pain in your abdominal area.  You have lower back pain.  You have persistent nausea, vomiting, or  diarrhea.  You have an unusual or bad smelling vaginal discharge.  You have pain when you urinate. Get help right away if:  Your water breaks before 37 weeks.  You have regular contractions less than 5 minutes apart before 37 weeks.  You have a fever.  You are leaking fluid from your vagina.  You have spotting or bleeding from your vagina.  You have severe abdominal pain or cramping.  You have rapid weight loss or weight gain.  You have   shortness of breath with chest pain.  You notice sudden or extreme swelling of your face, hands, ankles, feet, or legs.  Your baby makes fewer than 10 movements in 2 hours.  You have severe headaches that do not go away when you take medicine.  You have vision changes. Summary  The third trimester is from week 28 through week 40, months 7 through 9. The third trimester is a time when the unborn baby (fetus) is growing rapidly.  During the third trimester, your discomfort may increase as you and your baby continue to gain weight. You may have abdominal, leg, and back pain, sleeping problems, and an increased need to urinate.  During the third trimester your breasts will keep growing and they will continue to become tender. A yellow fluid (colostrum) may leak from your breasts. This is the first milk you are producing for your baby.  False labor is a condition in which you feel small, irregular tightenings of the muscles in the womb (contractions) that eventually go away. These are called Braxton Hicks contractions. Contractions may last for hours, days, or even weeks before true labor sets in.  Signs of labor can include: abdominal cramps; regular contractions that start at 10 minutes apart and become stronger and more frequent with time; watery or bloody mucus discharge that comes from the vagina; increased pelvic pressure and dull back pain; and leaking of amniotic fluid. This information is not intended to replace advice given to you by your  health care provider. Make sure you discuss any questions you have with your health care provider. Document Released: 02/25/2001 Document Revised: 04/08/2016 Document Reviewed: 04/08/2016 Elsevier Interactive Patient Education  2019 [employer]  

## 2018-06-10 ENCOUNTER — Encounter: Payer: Self-pay | Admitting: Certified Nurse Midwife

## 2018-06-10 ENCOUNTER — Ambulatory Visit (INDEPENDENT_AMBULATORY_CARE_PROVIDER_SITE_OTHER): Payer: Medicaid Other | Admitting: Certified Nurse Midwife

## 2018-06-10 ENCOUNTER — Other Ambulatory Visit: Payer: Self-pay

## 2018-06-10 VITALS — BP 107/63 | HR 89 | Wt 186.1 lb

## 2018-06-10 DIAGNOSIS — O99019 Anemia complicating pregnancy, unspecified trimester: Secondary | ICD-10-CM

## 2018-06-10 DIAGNOSIS — O99013 Anemia complicating pregnancy, third trimester: Secondary | ICD-10-CM

## 2018-06-10 DIAGNOSIS — Z3A32 32 weeks gestation of pregnancy: Secondary | ICD-10-CM

## 2018-06-10 DIAGNOSIS — Z3480 Encounter for supervision of other normal pregnancy, unspecified trimester: Secondary | ICD-10-CM

## 2018-06-10 NOTE — Progress Notes (Signed)
   PRENATAL VISIT NOTE  Subjective:  Kristen Grimes is a 28 y.o. G3P2002 at [redacted]w[redacted]d being seen today for ongoing prenatal care.  She is currently monitored for the following issues for this low-risk pregnancy and has Seasonal rhinitis; Supervision of other normal pregnancy, antepartum; and Anemia affecting pregnancy, antepartum on their problem list.  Patient reports no complaints.  Contractions: Not present. Vag. Bleeding: None.  Movement: Present. Denies leaking of fluid.   The following portions of the patient's history were reviewed and updated as appropriate: allergies, current medications, past family history, past medical history, past social history, past surgical history and problem list.   Objective:   Vitals:   06/10/18 1507  BP: 107/63  Pulse: 89  Weight: 186 lb 1.6 oz (84.4 kg)    Fetal Status: Fetal Heart Rate (bpm): 154   Movement: Present     General:  Alert, oriented and cooperative. Patient is in no acute distress.  Skin: Skin is warm and dry. No rash noted.   Cardiovascular: Normal heart rate noted  Respiratory: Normal respiratory effort, no problems with respiration noted  Abdomen: Soft, gravid, appropriate for gestational age.  Pain/Pressure: Present     Pelvic: Cervical exam deferred        Extremities: Normal range of motion.  Edema: None  Mental Status: Normal mood and affect. Normal behavior. Normal judgment and thought content.   Assessment and Plan:  Pregnancy: G3P2002 at [redacted]w[redacted]d 1. Supervision of other normal pregnancy, antepartum - patient doing well, no complaints - Educated and discussed televisit with next appointment and then face to face appointment at 36 weeks for GBS swabs  - Anticipatory guidance on upcoming appointments  - routine prenatal care   2. Anemia affecting pregnancy, antepartum - repeat CBC at 36 weeks   Preterm labor symptoms and general obstetric precautions including but not limited to vaginal bleeding, contractions, leaking of  fluid and fetal movement were reviewed in detail with the patient. Please refer to After Visit Summary for other counseling recommendations.   Return in about 2 weeks (around 06/24/2018) for TELEVISIT-ROB.  Future Appointments  Date Time Provider Department Center  06/24/2018  2:15 PM Sharyon Cable, CNM [facility] None    Sharyon Cable, CNM

## 2018-06-10 NOTE — Patient Instructions (Signed)

## 2018-06-24 ENCOUNTER — Ambulatory Visit (INDEPENDENT_AMBULATORY_CARE_PROVIDER_SITE_OTHER): Payer: Medicaid Other | Admitting: Certified Nurse Midwife

## 2018-06-24 ENCOUNTER — Other Ambulatory Visit: Payer: Self-pay

## 2018-06-24 ENCOUNTER — Encounter: Payer: Self-pay | Admitting: Certified Nurse Midwife

## 2018-06-24 DIAGNOSIS — Z3480 Encounter for supervision of other normal pregnancy, unspecified trimester: Secondary | ICD-10-CM

## 2018-06-24 DIAGNOSIS — Z3483 Encounter for supervision of other normal pregnancy, third trimester: Secondary | ICD-10-CM | POA: Diagnosis not present

## 2018-06-24 DIAGNOSIS — Z3A34 34 weeks gestation of pregnancy: Secondary | ICD-10-CM | POA: Diagnosis not present

## 2018-06-24 NOTE — Progress Notes (Signed)
S/w pt via tele visit, pt needs BP cuff, provider advised, pt can have cuff at next visit. Pt reports fetal movement with occasional pressure.

## 2018-06-24 NOTE — Progress Notes (Signed)
   TELEHEALTH VIRTUAL OBSTETRICS VISIT ENCOUNTER NOTE  I connected with Kristen Grimes on 06/24/18 at  2:15 PM EDT by telephone at home and verified that I am speaking with the correct person using two identifiers.   I discussed the limitations, risks, security and privacy concerns of performing an evaluation and management service by telephone and the availability of in person appointments. I also discussed with the patient that there may be a patient responsible charge related to this service. The patient expressed understanding and agreed to proceed.  Subjective:  Kristen Grimes is a 28 y.o. G3P2002 at [redacted]w[redacted]d being followed for ongoing prenatal care.  She is currently monitored for the following issues for this low-risk pregnancy and has Seasonal rhinitis; Supervision of other normal pregnancy, antepartum; and Anemia affecting pregnancy, antepartum on their problem list.  Patient reports no complaints. Reports fetal movement. Denies any contractions, bleeding or leaking of fluid.   The following portions of the patient's history were reviewed and updated as appropriate: allergies, current medications, past family history, past medical history, past social history, past surgical history and problem list.   Objective:   General:  Alert, oriented and cooperative.   Mental Status: Normal mood and affect perceived. Normal judgment and thought content.  Rest of physical exam deferred due to type of encounter  Assessment and Plan:  Pregnancy: G3P2002 at [redacted]w[redacted]d 1. Supervision of other normal pregnancy, antepartum - Patient doing well, no complaints - Anticipatory guidance on upcoming appointments with next appointment being in person for GBS and GC/C swabs  - COVID 19 precautions - Pt does not have BP cuff at home, will give BP cuff to patient at next visit  - Babyscripts Schedule Optimization  Preterm labor symptoms and general obstetric precautions including but not limited to vaginal  bleeding, contractions, leaking of fluid and fetal movement were reviewed in detail with the patient.  I discussed the assessment and treatment plan with the patient. The patient was provided an opportunity to ask questions and all were answered. The patient agreed with the plan and demonstrated an understanding of the instructions. The patient was advised to call back or seek an in-person office evaluation/go to [facility]U at [facility] for any urgent or concerning symptoms. Please refer to After Visit Summary for other counseling recommendations.   I provided 7 minutes of non-face-to-face time during this encounter.  Return in about 2 weeks (around 07/08/2018) for ROB/36wkGBS.  Sharyon Cable, CNM Center for [employer], [facility]

## 2018-06-29 ENCOUNTER — Other Ambulatory Visit: Payer: Self-pay | Admitting: Family Medicine

## 2018-06-29 MED ORDER — BLOOD PRESSURE MONITOR KIT: 1.0000 | PACK | 0 refills | Status: AC

## 2018-07-08 ENCOUNTER — Other Ambulatory Visit: Payer: Self-pay

## 2018-07-08 ENCOUNTER — Other Ambulatory Visit (HOSPITAL_COMMUNITY)
Admission: RE | Admit: 2018-07-08 | Discharge: 2018-07-08 | Disposition: A | Discharge status: Home / Self Care | Payer: Medicaid Other | Source: Ambulatory Visit | Attending: Obstetrics and Gynecology | Admitting: Obstetrics and Gynecology

## 2018-07-08 ENCOUNTER — Ambulatory Visit (INDEPENDENT_AMBULATORY_CARE_PROVIDER_SITE_OTHER): Payer: Medicaid Other | Admitting: Obstetrics and Gynecology

## 2018-07-08 ENCOUNTER — Encounter: Payer: Self-pay | Admitting: Obstetrics and Gynecology

## 2018-07-08 DIAGNOSIS — Z3A36 36 weeks gestation of pregnancy: Secondary | ICD-10-CM

## 2018-07-08 DIAGNOSIS — Z3483 Encounter for supervision of other normal pregnancy, third trimester: Secondary | ICD-10-CM

## 2018-07-08 DIAGNOSIS — Z3480 Encounter for supervision of other normal pregnancy, unspecified trimester: Secondary | ICD-10-CM | POA: Diagnosis not present

## 2018-07-08 MED ORDER — BLOOD PRESSURE MONITOR KIT: 1.0000 | PACK | Status: AC

## 2018-07-08 NOTE — Progress Notes (Signed)
Subjective:  Kristen Grimes is a 28 y.o. G3P2002 at [redacted]w[redacted]d being seen today for ongoing prenatal care.  She is currently monitored for the following issues for this low-risk pregnancy and has Seasonal rhinitis; Supervision of other normal pregnancy, antepartum; and Anemia affecting pregnancy, antepartum on their problem list.  Patient reports general discomforts of pregnancy.  Contractions: Irregular. Vag. Bleeding: None.  Movement: Present. Denies leaking of fluid.   The following portions of the patient's history were reviewed and updated as appropriate: allergies, current medications, past family history, past medical history, past social history, past surgical history and problem list. Problem list updated.  Objective:   Vitals:   07/08/18 1012  BP: 114/67  Pulse: 92  Weight: 190 lb 4.8 oz (86.3 kg)    Fetal Status: Fetal Heart Rate (bpm): 145   Movement: Present     General:  Alert, oriented and cooperative. Patient is in no acute distress.  Skin: Skin is warm and dry. No rash noted.   Cardiovascular: Normal heart rate noted  Respiratory: Normal respiratory effort, no problems with respiration noted  Abdomen: Soft, gravid, appropriate for gestational age. Pain/Pressure: Present     Pelvic:  Cervical exam performed        Extremities: Normal range of motion.  Edema: None  Mental Status: Normal mood and affect. Normal behavior. Normal judgment and thought content.   Urinalysis:      Assessment and Plan:  Pregnancy: G3P2002 at [redacted]w[redacted]d  1. Supervision of other normal pregnancy, antepartum Labor cautions reviewed - Strep Gp B NAA - Cervicovaginal ancillary only( [place])  Preterm labor symptoms and general obstetric precautions including but not limited to vaginal bleeding, contractions, leaking of fluid and fetal movement were reviewed in detail with the patient. Please refer to After Visit Summary for other counseling recommendations.  Return in about 2 weeks (around  07/22/2018) for OB visit, televisit.   Hermina Staggers, MD

## 2018-07-08 NOTE — Progress Notes (Signed)
Patient reports fetal movement with occasional contractions. Pt will have BP cuff mailed to her house.

## 2018-07-08 NOTE — Patient Instructions (Signed)

## 2018-07-09 LAB — CERVICOVAGINAL ANCILLARY ONLY
Chlamydia: NEGATIVE
Neisseria Gonorrhea: POSITIVE — AB

## 2018-07-10 LAB — STREP GP B NAA: Strep Gp B NAA: POSITIVE — AB

## 2018-07-15 ENCOUNTER — Other Ambulatory Visit: Payer: Self-pay

## 2018-07-15 ENCOUNTER — Ambulatory Visit (INDEPENDENT_AMBULATORY_CARE_PROVIDER_SITE_OTHER): Payer: Medicaid Other

## 2018-07-15 VITALS — BP 110/65 | HR 84 | Ht 68.0 in | Wt 189.4 lb

## 2018-07-15 DIAGNOSIS — O98213 Gonorrhea complicating pregnancy, third trimester: Secondary | ICD-10-CM | POA: Diagnosis not present

## 2018-07-15 MED ORDER — CEFTRIAXONE SODIUM 250 MG IJ SOLR
250.0000 mg | Freq: Once | INTRAMUSCULAR | Status: AC
  Administered 2018-07-15: 250 mg via INTRAMUSCULAR

## 2018-07-15 NOTE — Progress Notes (Signed)
OB presented for Rocephin Injection for + Gonorrhea. Tx according to office procedure. Office Stock Rocephin given in [place], tolerated well.  Advised pt to notify partner and that partner needs to be treated as well. She should refrain from IC until TOC in 3-4 weeks.  Administrations This Visit    cefTRIAXone (ROCEPHIN) injection 250 mg    Admin Date 07/15/2018 Action Given Dose 250 mg Route Intramuscular Administered By Maretta Bees, RMA

## 2018-07-19 ENCOUNTER — Other Ambulatory Visit: Payer: Self-pay | Admitting: *Deleted

## 2018-07-19 ENCOUNTER — Telehealth: Payer: Self-pay | Admitting: *Deleted

## 2018-07-19 MED ORDER — AZITHROMYCIN 250 MG PO TABS: 1000.0000 mg | ORAL_TABLET | Freq: Once | ORAL | 0 refills | Status: AC

## 2018-07-19 NOTE — Progress Notes (Signed)
zithromax sent for +GC results. Pt has had Rocephin. LM for pt to call office.

## 2018-07-19 NOTE — Telephone Encounter (Signed)
Pt made aware of Rx sent to pharmacy.

## 2018-07-22 ENCOUNTER — Encounter: Payer: Self-pay | Admitting: Obstetrics

## 2018-07-22 ENCOUNTER — Other Ambulatory Visit: Payer: Self-pay

## 2018-07-22 ENCOUNTER — Ambulatory Visit (INDEPENDENT_AMBULATORY_CARE_PROVIDER_SITE_OTHER): Payer: Medicaid Other | Admitting: Obstetrics

## 2018-07-22 DIAGNOSIS — Z3483 Encounter for supervision of other normal pregnancy, third trimester: Secondary | ICD-10-CM

## 2018-07-22 DIAGNOSIS — Z3480 Encounter for supervision of other normal pregnancy, unspecified trimester: Secondary | ICD-10-CM

## 2018-07-22 DIAGNOSIS — Z3A38 38 weeks gestation of pregnancy: Secondary | ICD-10-CM

## 2018-07-22 DIAGNOSIS — O99820 Streptococcus B carrier state complicating pregnancy: Secondary | ICD-10-CM

## 2018-07-22 DIAGNOSIS — A5403 Gonococcal cervicitis, unspecified: Secondary | ICD-10-CM

## 2018-07-22 NOTE — Progress Notes (Signed)
+  GC on 07/08/18 +GBS   CC: None  Pt states she completed treatment Rx for STD.

## 2018-07-22 NOTE — Progress Notes (Signed)
   TELEHEALTH VIRTUAL OBSTETRICS PRENATAL VISIT ENCOUNTER NOTE  I connected with Teran Mulvihill on 07/22/18 at 10:15 AM EDT by WebEx at home and verified that I am speaking with the correct person using two identifiers.   I discussed the limitations, risks, security and privacy concerns of performing an evaluation and management service by telephone and the availability of in person appointments. I also discussed with the patient that there may be a patient responsible charge related to this service. The patient expressed understanding and agreed to proceed. Subjective:  Kristen Grimes is a 28 y.o. G3P2002 at [redacted]w[redacted]d being seen today for ongoing prenatal care.  She is currently monitored for the following issues for this low-risk pregnancy and has Seasonal rhinitis; Supervision of other normal pregnancy, antepartum; and Anemia affecting pregnancy, antepartum on their problem list.  Patient reports backache.  Reports fetal movement. Contractions: Irritability. Vag. Bleeding: None.  Movement: Present. Denies any contractions, bleeding or leaking of fluid.   The following portions of the patient's history were reviewed and updated as appropriate: allergies, current medications, past family history, past medical history, past social history, past surgical history and problem list.   Objective:  There were no vitals filed for this visit.  Fetal Status:     Movement: Present     General:  Alert, oriented and cooperative. Patient is in no acute distress.  Respiratory: Normal respiratory effort, no problems with respiration noted  Mental Status: Normal mood and affect. Normal behavior. Normal judgment and thought content.  Rest of physical exam deferred due to type of encounter  Assessment and Plan:  Pregnancy: G3P2002 at [redacted]w[redacted]d  1. Supervision of other normal pregnancy, antepartum  2. GC cervicitis, acute.  Treated on 07-15-2018.  3. GBS (group B Streptococcus carrier), +RV culture, currently  pregnant - treat in labor   Term labor symptoms and general obstetric precautions including but not limited to vaginal bleeding, contractions, leaking of fluid and fetal movement were reviewed in detail with the patient. I discussed the assessment and treatment plan with the patient. The patient was provided an opportunity to ask questions and all were answered. The patient agreed with the plan and demonstrated an understanding of the instructions. The patient was advised to call back or seek an in-person office evaluation/go to MAU at [facility] for any urgent or concerning symptoms. Please refer to After Visit Summary for other counseling recommendations.   I provided 10 minutes of face-to-face via WebEx time during this encounter.  Return in about 1 week (around 07/29/2018) for ROB in office.   Coral Ceo, MD Center for [facility], [facility] 07-22-2018

## 2018-07-24 ENCOUNTER — Encounter (HOSPITAL_COMMUNITY): Payer: Self-pay | Admitting: Emergency Medicine

## 2018-07-24 ENCOUNTER — Ambulatory Visit (HOSPITAL_COMMUNITY)
Admission: EM | Admit: 2018-07-24 | Discharge: 2018-07-24 | Disposition: A | Discharge status: Home / Self Care | Payer: Medicaid Other | Source: Home / Self Care | Attending: Family Medicine | Admitting: Family Medicine

## 2018-07-24 DIAGNOSIS — H6122 Impacted cerumen, left ear: Secondary | ICD-10-CM | POA: Diagnosis not present

## 2018-07-24 NOTE — ED Provider Notes (Signed)
[facility]    CSN: [id number] Arrival date & time: 07/24/18  1037     History   Chief Complaint Chief Complaint  Patient presents with  . Ear Problem    HPI Kristen Grimes is a 28 y.o. female.   28 year old female [redacted] weeks pregnant comes in for 3 day history of ear fullness, muffled hearing. States symptoms started after using cotton swab. She noticed strips of cotton when pulling out the swab and wonders if cotton was left in the ear canal. Denies pain, ear drainage. Denies URI symptoms such as cough, congestion, sore throat. Denies fever, chills, night sweats.      Past Medical History:  Diagnosis Date  . Medical history non-contributory     Patient Active Problem List   Diagnosis Date Noted  . Anemia affecting pregnancy, antepartum 01/27/2018  . Supervision of other normal pregnancy, antepartum 01/13/2018  . Seasonal rhinitis 06/18/2013    Past Surgical History:  Procedure Laterality Date  . NO PAST SURGERIES      OB History    Gravida  3   Para  2   Term  2   Preterm      AB      Living  2     SAB      TAB      Ectopic      Multiple      Live Births  2            Home Medications    Prior to Admission medications   Medication Sig Start Date End Date Taking? Authorizing Provider  Prenat-Fe Poly-Methfol-FA-DHA (VITAFOL ULTRA) 29-0.6-0.4-200 MG CAPS Take 1 tablet by mouth daily. 04/27/18   Dorathy Kinsman, CNM    Family History Family History  Problem Relation Age of Onset  . Multiple sclerosis Mother     Social History Social History   Tobacco Use  . Smoking status: Never Smoker  . Smokeless tobacco: Never Used  Substance Use Topics  . Alcohol use: Yes    Comment: occ wine- stop with pregnancy  . Drug use: No     Allergies   Patient has no known allergies.   Review of Systems Review of Systems  Reason unable to perform ROS: See HPI as above.     Physical Exam Triage Vital Signs ED Triage Vitals   Enc Vitals Group     BP 07/24/18 1119 120/66     Pulse Rate 07/24/18 1119 91     Resp 07/24/18 1119 18     Temp 07/24/18 1119 98.3 F (36.8 C)     Temp src --      SpO2 07/24/18 1119 100 %     Weight --      Height --      Head Circumference --      Peak Flow --      Pain Score 07/24/18 1118 0     Pain Loc --      Pain Edu? --      Excl. in GC? --    No data found.  Updated Vital Signs BP 120/66   Pulse 91   Temp 98.3 F (36.8 C)   Resp 18   LMP 10/27/2017   SpO2 100%    Physical Exam Constitutional:      General: She is not in acute distress.    Appearance: She is well-developed. She is not diaphoretic.  HENT:     Head: Normocephalic and atraumatic.  Ears:     Comments: No tenderness to palpation of the tragus bilaterally. Right ear with normal appearing ear canal and TM. No ear canal swelling, erythema. TM is pearly grey, no erythema, bulging.  Cerumen impaction of the left ear. TM not visible. No obvious foreign body.   Post ear irrigation: left TM pearly grey, no erythema, bulging.  Eyes:     Conjunctiva/sclera: Conjunctivae normal.     Pupils: Pupils are equal, round, and reactive to light.  Neurological:     Mental Status: She is alert and oriented to person, place, and time.      UC Treatments / Results  Labs (all labs ordered are listed, but only abnormal results are displayed) Labs Reviewed - No data to display  EKG None  Radiology No results found.  Procedures Procedures (including critical care time)  Medications Ordered in UC Medications - No data to display  Initial Impression / Assessment and Plan / UC Course  I have reviewed the triage vital signs and the nursing notes.  Pertinent labs & imaging results that were available during my care of the patient were reviewed by me and considered in my medical decision making (see chart for details).    Patient with resolution of symptoms after ear irrigation. Discussed options to  prevent further cerumen impaction. Return precautions given. Patient expresses understanding and agrees to plan.  Final Clinical Impressions(s) / UC Diagnoses   Final diagnoses:  Impacted cerumen of left ear   ED Prescriptions    None        Belinda FisherYu, Safiatou Islam V, PA-C 07/24/18 1217

## 2018-07-24 NOTE — Discharge Instructions (Signed)
You can use hydrogen peroxide intermittently to help with ear wax buildup. Do not use qtips.

## 2018-07-24 NOTE — ED Triage Notes (Signed)
Pt c/o cotton from a q tip stuck in her L ear x3 days.

## 2018-07-25 ENCOUNTER — Inpatient Hospital Stay (HOSPITAL_COMMUNITY)
Admission: AD | Admit: 2018-07-25 | Discharge: 2018-07-27 | DRG: 806 | Disposition: A | Discharge status: Home / Self Care | Payer: Medicaid Other | Attending: Obstetrics and Gynecology | Admitting: Obstetrics and Gynecology

## 2018-07-25 ENCOUNTER — Encounter (HOSPITAL_COMMUNITY): Payer: Self-pay

## 2018-07-25 ENCOUNTER — Other Ambulatory Visit: Payer: Self-pay

## 2018-07-25 DIAGNOSIS — Z3A38 38 weeks gestation of pregnancy: Secondary | ICD-10-CM | POA: Diagnosis not present

## 2018-07-25 DIAGNOSIS — Z3480 Encounter for supervision of other normal pregnancy, unspecified trimester: Secondary | ICD-10-CM

## 2018-07-25 DIAGNOSIS — O98219 Gonorrhea complicating pregnancy, unspecified trimester: Secondary | ICD-10-CM

## 2018-07-25 DIAGNOSIS — D649 Anemia, unspecified: Secondary | ICD-10-CM | POA: Diagnosis present

## 2018-07-25 DIAGNOSIS — O99820 Streptococcus B carrier state complicating pregnancy: Secondary | ICD-10-CM

## 2018-07-25 DIAGNOSIS — O99019 Anemia complicating pregnancy, unspecified trimester: Secondary | ICD-10-CM | POA: Diagnosis present

## 2018-07-25 DIAGNOSIS — O9902 Anemia complicating childbirth: Secondary | ICD-10-CM | POA: Diagnosis present

## 2018-07-25 DIAGNOSIS — O9822 Gonorrhea complicating childbirth: Secondary | ICD-10-CM | POA: Diagnosis present

## 2018-07-25 DIAGNOSIS — O99824 Streptococcus B carrier state complicating childbirth: Secondary | ICD-10-CM | POA: Diagnosis present

## 2018-07-25 DIAGNOSIS — O26893 Other specified pregnancy related conditions, third trimester: Secondary | ICD-10-CM | POA: Diagnosis present

## 2018-07-25 HISTORY — DX: Streptococcus B carrier state complicating pregnancy: O99.820

## 2018-07-25 HISTORY — DX: Gonorrhea complicating pregnancy, unspecified trimester: O98.219

## 2018-07-25 LAB — CBC
HCT: 37.7 % (ref 36.0–46.0)
Hemoglobin: 12.7 g/dL (ref 12.0–15.0)
MCH: 30.4 pg (ref 26.0–34.0)
MCHC: 33.7 g/dL (ref 30.0–36.0)
MCV: 90.2 fL (ref 80.0–100.0)
Platelets: 230 10*3/uL (ref 150–400)
RBC: 4.18 MIL/uL (ref 3.87–5.11)
RDW: 14.5 % (ref 11.5–15.5)
WBC: 7.7 10*3/uL (ref 4.0–10.5)
nRBC: 0 % (ref 0.0–0.2)

## 2018-07-25 LAB — TYPE AND SCREEN
ABO/RH(D): B POS
Antibody Screen: NEGATIVE

## 2018-07-25 MED ORDER — MEASLES, MUMPS & RUBELLA VAC IJ SOLR: 0.5000 mL | Freq: Once | INTRAMUSCULAR | Status: DC

## 2018-07-25 MED ORDER — BENZOCAINE-MENTHOL 20-0.5 % EX AERO: 1.0000 "application " | INHALATION_SPRAY | CUTANEOUS | Status: DC | PRN

## 2018-07-25 MED ORDER — SODIUM CHLORIDE 0.9 % IV SOLN
2.0000 g | Freq: Once | INTRAVENOUS | Status: AC
  Administered 2018-07-25: 2 g via INTRAVENOUS
  Filled 2018-07-25: qty 2000

## 2018-07-25 MED ORDER — FLEET ENEMA 7-19 GM/118ML RE ENEM: 1.0000 | ENEMA | RECTAL | Status: DC | PRN

## 2018-07-25 MED ORDER — EPHEDRINE 5 MG/ML INJ: 10.0000 mg | INTRAVENOUS | Status: DC | PRN

## 2018-07-25 MED ORDER — DIPHENHYDRAMINE HCL 25 MG PO CAPS: 25.0000 mg | ORAL_CAPSULE | Freq: Four times a day (QID) | ORAL | Status: DC | PRN

## 2018-07-25 MED ORDER — IBUPROFEN 600 MG PO TABS
600.0000 mg | ORAL_TABLET | Freq: Four times a day (QID) | ORAL | Status: DC
  Administered 2018-07-25 – 2018-07-27 (×8): 600 mg via ORAL
  Filled 2018-07-25 (×8): qty 1

## 2018-07-25 MED ORDER — ZOLPIDEM TARTRATE 5 MG PO TABS: 5.0000 mg | ORAL_TABLET | Freq: Every evening | ORAL | Status: DC | PRN

## 2018-07-25 MED ORDER — SIMETHICONE 80 MG PO CHEW: 80.0000 mg | CHEWABLE_TABLET | ORAL | Status: DC | PRN

## 2018-07-25 MED ORDER — SOD CITRATE-CITRIC ACID 500-334 MG/5ML PO SOLN: 30.0000 mL | ORAL | Status: DC | PRN

## 2018-07-25 MED ORDER — COCONUT OIL OIL: 1.0000 "application " | TOPICAL_OIL | Status: DC | PRN

## 2018-07-25 MED ORDER — SENNOSIDES-DOCUSATE SODIUM 8.6-50 MG PO TABS
2.0000 | ORAL_TABLET | ORAL | Status: DC
  Administered 2018-07-26 – 2018-07-27 (×2): 2 via ORAL
  Filled 2018-07-25 (×2): qty 2

## 2018-07-25 MED ORDER — LACTATED RINGERS IV SOLN
INTRAVENOUS | Status: DC
  Administered 2018-07-25: 10:00:00 via INTRAVENOUS

## 2018-07-25 MED ORDER — ONDANSETRON HCL 4 MG/2ML IJ SOLN: 4.0000 mg | INTRAMUSCULAR | Status: DC | PRN

## 2018-07-25 MED ORDER — PRENATAL MULTIVITAMIN CH
1.0000 | ORAL_TABLET | Freq: Every day | ORAL | Status: DC
  Administered 2018-07-26 – 2018-07-27 (×2): 1 via ORAL
  Filled 2018-07-25 (×2): qty 1

## 2018-07-25 MED ORDER — OXYTOCIN BOLUS FROM INFUSION
500.0000 mL | Freq: Once | INTRAVENOUS | Status: AC
  Administered 2018-07-25: 500 mL via INTRAVENOUS

## 2018-07-25 MED ORDER — DIPHENHYDRAMINE HCL 50 MG/ML IJ SOLN: 12.5000 mg | INTRAMUSCULAR | Status: DC | PRN

## 2018-07-25 MED ORDER — ONDANSETRON HCL 4 MG/2ML IJ SOLN: 4.0000 mg | Freq: Four times a day (QID) | INTRAMUSCULAR | Status: DC | PRN

## 2018-07-25 MED ORDER — LACTATED RINGERS IV SOLN: 500.0000 mL | Freq: Once | INTRAVENOUS | Status: DC

## 2018-07-25 MED ORDER — LIDOCAINE HCL (PF) 1 % IJ SOLN: 30.0000 mL | INTRAMUSCULAR | Status: DC | PRN

## 2018-07-25 MED ORDER — TETANUS-DIPHTH-ACELL PERTUSSIS 5-2.5-18.5 LF-MCG/0.5 IM SUSP: 0.5000 mL | Freq: Once | INTRAMUSCULAR | Status: DC

## 2018-07-25 MED ORDER — ACETAMINOPHEN 325 MG PO TABS: 650.0000 mg | ORAL_TABLET | ORAL | Status: DC | PRN

## 2018-07-25 MED ORDER — ONDANSETRON HCL 4 MG PO TABS: 4.0000 mg | ORAL_TABLET | ORAL | Status: DC | PRN

## 2018-07-25 MED ORDER — FENTANYL-BUPIVACAINE-NACL 0.5-0.125-0.9 MG/250ML-% EP SOLN: 12.0000 mL/h | EPIDURAL | Status: DC | PRN

## 2018-07-25 MED ORDER — WITCH HAZEL-GLYCERIN EX PADS: 1.0000 "application " | MEDICATED_PAD | CUTANEOUS | Status: DC | PRN

## 2018-07-25 MED ORDER — PENICILLIN G 3 MILLION UNITS IVPB - SIMPLE MED
3.0000 10*6.[IU] | INTRAVENOUS | Status: DC
  Filled 2018-07-25 (×7): qty 100

## 2018-07-25 MED ORDER — DIBUCAINE (PERIANAL) 1 % EX OINT: 1.0000 "application " | TOPICAL_OINTMENT | CUTANEOUS | Status: DC | PRN

## 2018-07-25 MED ORDER — OXYTOCIN 40 UNITS IN NORMAL SALINE INFUSION - SIMPLE MED
2.5000 [IU]/h | INTRAVENOUS | Status: DC
  Administered 2018-07-25: 2.5 [IU]/h via INTRAVENOUS
  Filled 2018-07-25: qty 1000

## 2018-07-25 MED ORDER — FENTANYL CITRATE (PF) 100 MCG/2ML IJ SOLN: 100.0000 ug | INTRAMUSCULAR | Status: DC | PRN

## 2018-07-25 MED ORDER — LACTATED RINGERS IV SOLN: 500.0000 mL | INTRAVENOUS | Status: DC | PRN

## 2018-07-25 MED ORDER — PHENYLEPHRINE 40 MCG/ML (10ML) SYRINGE FOR IV PUSH (FOR BLOOD PRESSURE SUPPORT): 80.0000 ug | PREFILLED_SYRINGE | INTRAVENOUS | Status: DC | PRN

## 2018-07-25 NOTE — Hospital Discharge Follow-Up (Deleted)
OBSTETRIC ADMISSION HISTORY AND PHYSICAL  *Note completed after delivery as actively involved in the care of other patients at time of admission* Falyn Luyster is a 28 y.o. female G65P2002 with IUP at [redacted]w[redacted]d by L/18 presenting for spontaneous onset of labor and SROM at home around 0400 with light meconium.   Reports fetal movement. Denies vaginal bleeding.  She received her prenatal care at [facility].  Support person in labor: Husband, FOB  Ultrasounds . Anatomy U/S: 18w2, EFW 56%, anterior placenta, normal anatomy   Prenatal History/Complications: Marland Kitchen Gonorrhea - treated but no test of cure . Anemia  . GBS positive  Past Medical History: Past Medical History:  Diagnosis Date  . Medical history non-contributory     Past Surgical History: Past Surgical History:  Procedure Laterality Date  . NO PAST SURGERIES      Obstetrical History: OB History    Gravida  3   Para  2   Term  2   Preterm      AB      Living  2     SAB      TAB      Ectopic      Multiple      Live Births  2           Social History: Social History   Socioeconomic History  . Marital status: Single    Spouse name: Not on file  . Number of children: Not on file  . Years of education: Not on file  . Highest education level: Not on file  Occupational History  . Not on file  Social Needs  . Financial resource strain: Not on file  . Food insecurity:    Worry: Not on file    Inability: Not on file  . Transportation needs:    Medical: Not on file    Non-medical: Not on file  Tobacco Use  . Smoking status: Never Smoker  . Smokeless tobacco: Never Used  Substance and Sexual Activity  . Alcohol use: Yes    Comment: occ wine- stop with pregnancy  . Drug use: No  . Sexual activity: Not Currently    Birth control/protection: None  Lifestyle  . Physical activity:    Days per week: Not on file    Minutes per session: Not on file  . Stress: Not on file  Relationships  . Social  connections:    Talks on phone: Not on file    Gets together: Not on file    Attends religious service: Not on file    Active member of club or organization: Not on file    Attends meetings of clubs or organizations: Not on file    Relationship status: Not on file  Other Topics Concern  . Not on file  Social History Narrative  . Not on file    Family History: Family History  Problem Relation Age of Onset  . Multiple sclerosis Mother     Allergies: No Known Allergies  Medications Prior to Admission  Medication Sig Dispense Refill Last Dose  . Prenat-Fe Poly-Methfol-FA-DHA (VITAFOL ULTRA) 29-0.6-0.4-200 MG CAPS Take 1 tablet by mouth daily. 30 capsule 12 Taking     Review of Systems  All systems reviewed and negative except as stated in HPI  Blood pressure 119/72, pulse 88, temperature 98.9 F (37.2 C), temperature source Oral, resp. rate 18, height 5\' 8"  (1.727 m), weight 86.2 kg, last menstrual period 10/27/2017, SpO2 99 %. General appearance: alert, mildly uncomfortable  appearing during contractions Lungs: no respiratory distress Heart: regular rate  Abdomen: soft, non-tender; gravid  Pelvic: deferred Extremities: no significant LE edema Presentation: cephalic by sutures on RN check  Fetal monitoring: 140s/mod/+a/-d Uterine activity: every 3-4 minutes Dilation: 8 Effacement (%): 90 Station: 0 Exam by:: J.Follmer,RNC  Prenatal labs: ABO, Rh: --/--/B POS, B POS Performed at [facility], [address] [place], [place] [address]  [phone](05/10 0942) Antibody: NEG (05/10 [id number]) Rubella: 3.41 (10/30 1624) RPR: Non Reactive (02/27 1042)  HBsAg: Negative (10/30 1624)  HIV: Non Reactive (02/27 1042)  GBS: Positive (04/23 1042)  Glucola: normal 2-hr Genetic screening:  declined  Prenatal Transfer Tool  Maternal Diabetes: No Genetic Screening: Declined Maternal Ultrasounds/Referrals: Normal Fetal Ultrasounds or other Referrals:  None Maternal Substance Abuse:   No Significant Maternal Medications:  None Significant Maternal Lab Results: None  Results for orders placed or performed during the hospital encounter of 07/25/18 (from the past 24 hour(s))  CBC   Collection Time: 07/25/18  9:42 AM  Result Value Ref Range   WBC 7.7 4.0 - 10.5 K/uL   RBC 4.18 3.87 - 5.11 MIL/uL   Hemoglobin 12.7 12.0 - 15.0 g/dL   HCT 30.837.7 65.736.0 - 84.646.0 %   MCV 90.2 80.0 - 100.0 fL   MCH 30.4 26.0 - 34.0 pg   MCHC 33.7 30.0 - 36.0 g/dL   RDW 96.214.5 95.211.5 - 84.115.5 %   Platelets 230 150 - 400 K/uL   nRBC 0.0 0.0 - 0.2 %  Type and screen [facility]   Collection Time: 07/25/18  9:42 AM  Result Value Ref Range   ABO/RH(D) B POS    Antibody Screen NEG    Sample Expiration      07/28/2018,2359 Performed at [facility], [address] [place], [place] [address]   ABO/Rh   Collection Time: 07/25/18  9:42 AM  Result Value Ref Range   ABO/RH(D)      B POS Performed at [facility], [address] [place], [place] [address]     Patient Active Problem List   Diagnosis Date Noted  . Gonorrhea affecting pregnancy 07/25/2018  . Group B streptococcal carriage complicating pregnancy 07/25/2018  . Normal labor 07/25/2018  . Anemia affecting pregnancy, antepartum 01/27/2018  . Supervision of other normal pregnancy, antepartum 01/13/2018  . Seasonal rhinitis 06/18/2013    Assessment/Plan:  Margorie Johneicorah Luz is a 28 y.o. G3P2002 at 480w5d here for spontaneous onset of labor, SROM around 0400 this morning.  Labor: Expectant management, anticipate SVD. -- pain control: declines at this time  Fetal Wellbeing: EFW 7lbs by Leopold's. Cephalic by sutures on RN check.  -- GBS (positive) - amp>pcn -- continuous fetal monitoring - category I   Postpartum Planning -- breast/nexplanon -- RI/[x] Tdap   Tallia Moehring S. Earlene PlaterWallace, DO OB/GYN Fellow

## 2018-07-25 NOTE — Discharge Summary (Signed)
Obstetrics Discharge Summary OB/GYN Faculty Practice   Patient Name: Margorie Johneicorah Batty DOB: [birth year] MRN: [id number]  Date of admission: 07/25/2018 Delivering MD: Tamera StandsWALLACE, LAUREL S   Date of discharge: 07/27/2018  Admitting diagnosis: CTX 3 MIN Intrauterine pregnancy: 3945w5d     Secondary diagnosis:   Principal Problem:   Normal labor Active Problems:   Anemia affecting pregnancy, antepartum   Gonorrhea affecting pregnancy   Group B streptococcal carriage complicating pregnancy   Discharge diagnosis: Term Pregnancy Delivered                                Postpartum procedures: None Complications: None  Outpatient Follow-Up: [ ]  ensure gonorrhea test-of-cure  [X]  Rx for OCPs for contraception - given at discharge   Hospital course: Margorie Johneicorah Carbonell is a 28 y.o. 4245w5d who was admitted for spontaneous onset of labor/SROM. Her pregnancy was complicated by above noted. Her labor course was notable for admission in active labor, quick progression to complete. Delivery was uncomplicated. Please see delivery/op note for additional details. Her postpartum course was uncomplicated. She was bottlefeeding without difficulty. By day of discharge, she was passing flatus, urinating, eating and drinking without difficulty. Her pain was well-controlled, and she was discharged home with Ibuprofen and Sprintec for contraception (instructed to start at 6 weeks postpartum). She will follow-up in clinic in 6 weeks.   Physical exam  Vitals:   07/25/18 2217 07/26/18 1417 07/26/18 2137 07/27/18 0540  BP: 118/70 108/68 113/64 117/70  Pulse: 78 85 75 78  Resp: 18 16 18 18   Temp: 98.7 F (37.1 C) 98.5 F (36.9 C) 97.9 F (36.6 C) 98 F (36.7 C)  TempSrc: Oral Oral Oral Oral  SpO2: 94%     Weight:      Height:       General: NAD Lochia: appropriate Uterine Fundus: firm DVT Evaluation: No evidence of DVT seen on physical exam. Negative Homan's sign. No cords or calf tenderness. Labs: Lab Results   Component Value Date   WBC 7.7 07/25/2018   HGB 12.7 07/25/2018   HCT 37.7 07/25/2018   MCV 90.2 07/25/2018   PLT 230 07/25/2018   No flowsheet data found.  Discharge instructions: Per After Visit Summary and "Baby and Me Booklet"  After visit meds:  Allergies as of 07/27/2018   No Known Allergies     Medication List    STOP taking these medications   Vitafol Ultra 29-0.6-0.4-200 MG Caps     TAKE these medications   acetaminophen 325 MG tablet Commonly known as:  TYLENOL Take 325 mg by mouth every 6 (six) hours as needed for headache.   ibuprofen 600 MG tablet Commonly known as:  ADVIL Take 1 tablet (600 mg total) by mouth every 6 (six) hours.   norgestimate-ethinyl estradiol 0.25-35 MG-MCG tablet Commonly known as:  ORTHO-CYCLEN Take 1 tablet by mouth daily. Start 6 weeks after delivery       Postpartum contraception: Combination OCPs Diet: Routine Diet Activity: Advance as tolerated. Pelvic rest for 6 weeks.   Follow-up Appt: Future Appointments  Date Time Provider Department Center  08/30/2018  9:00 AM Conan Bowensavis, Kelly M, MD [facility] None   Follow-up Visit: Please schedule this patient for Postpartum visit in: 4-6 weeks with the following provider: Any provider Low risk pregnancy complicated by: gonorrhea infection Delivery mode:  SVD Anticipated Birth Control:  OCPs PP Procedures needed: ensure gonorrhea TOC  Schedule Integrated BH visit: no  Newborn Data: Live born female  Birth Weight: 6 lb 13 oz (3090 g) APGAR: 9, 9  Newborn Delivery   Birth date/time:  07/25/2018 12:17:00 Delivery type:  Vaginal, Spontaneous    Baby Feeding: Bottle Disposition:home with mother   Jaynie Collins, MD, FACOG Obstetrician & Gynecologist, Faculty Practice Center for [employer], [facility]

## 2018-07-25 NOTE — H&P (Signed)
OBSTETRIC ADMISSION HISTORY AND PHYSICAL  *Note completed after delivery as actively involved in the care of other patients at time of admission - accidentally filed as hospital discharge follow-up initially* Kristen Grimes is a 28 y.o. female [id number] with IUP at 2874w5d by L/18 presenting for spontaneous onset of labor and SROM at home around 0400 with light meconium.   Reports fetal movement. Denies vaginal bleeding.  She received her prenatal care at [facility].  Support person in labor: Husband, FOB  Ultrasounds . Anatomy U/S: 18w2, EFW 56%, anterior placenta, normal anatomy   Prenatal History/Complications: Marland Kitchen. Gonorrhea - treated but no test of cure . Anemia  . GBS positive  Past Medical History: Past Medical History:  Diagnosis Date  . Medical history non-contributory     Past Surgical History: Past Surgical History:  Procedure Laterality Date  . NO PAST SURGERIES      Obstetrical History: OB History    Gravida  3   Para  2   Term  2   Preterm      AB      Living  2     SAB      TAB      Ectopic      Multiple      Live Births  2           Social History: Social History   Socioeconomic History  . Marital status: Single    Spouse name: Not on file  . Number of children: Not on file  . Years of education: Not on file  . Highest education level: Not on file  Occupational History  . Not on file  Social Needs  . Financial resource strain: Not on file  . Food insecurity:    Worry: Not on file    Inability: Not on file  . Transportation needs:    Medical: Not on file    Non-medical: Not on file  Tobacco Use  . Smoking status: Never Smoker  . Smokeless tobacco: Never Used  Substance and Sexual Activity  . Alcohol use: Yes    Comment: occ wine- stop with pregnancy  . Drug use: No  . Sexual activity: Not Currently    Birth control/protection: None  Lifestyle  . Physical activity:    Days per week: Not on file    Minutes per session:  Not on file  . Stress: Not on file  Relationships  . Social connections:    Talks on phone: Not on file    Gets together: Not on file    Attends religious service: Not on file    Active member of club or organization: Not on file    Attends meetings of clubs or organizations: Not on file    Relationship status: Not on file  Other Topics Concern  . Not on file  Social History Narrative  . Not on file    Family History: Family History  Problem Relation Age of Onset  . Multiple sclerosis Mother     Allergies: No Known Allergies  Medications Prior to Admission  Medication Sig Dispense Refill Last Dose  . Prenat-Fe Poly-Methfol-FA-DHA (VITAFOL ULTRA) 29-0.6-0.4-200 MG CAPS Take 1 tablet by mouth daily. 30 capsule 12 Taking     Review of Systems  All systems reviewed and negative except as stated in HPI  Blood pressure 119/72, pulse 88, temperature 98.9 F (37.2 C), temperature source Oral, resp. rate 18, height 5\' 8"  (1.727 m), weight 86.2 kg, last menstrual period 10/27/2017,  SpO2 99 %. General appearance: alert, mildly uncomfortable appearing during contractions Lungs: no respiratory distress Heart: regular rate  Abdomen: soft, non-tender; gravid  Pelvic: deferred Extremities: no significant LE edema Presentation: cephalic by sutures on RN check  Fetal monitoring: 140s/mod/+a/-d Uterine activity: every 3-4 minutes Dilation: 8 Effacement (%): 90 Station: 0 Exam by:: J.Follmer,RNC  Prenatal labs: ABO, Rh: --/--/B POS, B POS Performed at [facility], [address], [place], [place] [address]  [phone]) Antibody: NEG (05/10 9977) Rubella: 3.41 (10/30 1624) RPR: Non Reactive (02/27 1042)  HBsAg: Negative (10/30 1624)  HIV: Non Reactive (02/27 1042)  GBS: Positive (04/23 1042)  Glucola: normal 2-hr Genetic screening:  declined  Prenatal Transfer Tool  Maternal Diabetes: No Genetic Screening: Declined Maternal Ultrasounds/Referrals: Normal Fetal  Ultrasounds or other Referrals:  None Maternal Substance Abuse:  No Significant Maternal Medications:  None Significant Maternal Lab Results: None  Results for orders placed or performed during the hospital encounter of 07/25/18 (from the past 24 hour(s))  CBC   Collection Time: 07/25/18  9:42 AM  Result Value Ref Range   WBC 7.7 4.0 - 10.5 K/uL   RBC 4.18 3.87 - 5.11 MIL/uL   Hemoglobin 12.7 12.0 - 15.0 g/dL   HCT 41.4 23.9 - 53.2 %   MCV 90.2 80.0 - 100.0 fL   MCH 30.4 26.0 - 34.0 pg   MCHC 33.7 30.0 - 36.0 g/dL   RDW 02.3 34.3 - 56.8 %   Platelets 230 150 - 400 K/uL   nRBC 0.0 0.0 - 0.2 %  Type and screen [facility]   Collection Time: 07/25/18  9:42 AM  Result Value Ref Range   ABO/RH(D) B POS    Antibody Screen NEG    Sample Expiration      07/28/2018,2359 Performed at [facility], [address] [place], [place] [address]   ABO/Rh   Collection Time: 07/25/18  9:42 AM  Result Value Ref Range   ABO/RH(D)      B POS Performed at [facility], [address] [place], [place] [address]     Patient Active Problem List   Diagnosis Date Noted  . Gonorrhea affecting pregnancy 07/25/2018  . Group B streptococcal carriage complicating pregnancy 07/25/2018  . Normal labor 07/25/2018  . Anemia affecting pregnancy, antepartum 01/27/2018  . Supervision of other normal pregnancy, antepartum 01/13/2018  . Seasonal rhinitis 06/18/2013    Assessment/Plan:  Kristen Grimes is a 28 y.o. G3P2002 at [redacted]w[redacted]d here for spontaneous onset of labor, SROM around 0400 this morning.  Labor: Expectant management, anticipate SVD. -- pain control: declines at this time  Fetal Wellbeing: EFW 7lbs by Leopold's. Cephalic by sutures on RN check.  -- GBS (positive) - amp>pcn -- continuous fetal monitoring - category I   Postpartum Planning -- breast/nexplanon -- RI/[x] Tdap   Jammie Clink S. Earlene Plater, DO OB/GYN Fellow

## 2018-07-25 NOTE — Progress Notes (Signed)
Received call from Barnetta Chapel, NP that mom needed to be tested again for gonorrhea status.  New order placed.  Swab collected at 2000 and sent to lab.

## 2018-07-26 LAB — ABO/RH: ABO/RH(D): B POS

## 2018-07-26 LAB — GC/CHLAMYDIA PROBE AMP (~~LOC~~) NOT AT ARMC
Chlamydia: NEGATIVE
Chlamydia: NEGATIVE
Neisseria Gonorrhea: NEGATIVE
Neisseria Gonorrhea: NEGATIVE

## 2018-07-26 LAB — RPR: RPR Ser Ql: NONREACTIVE

## 2018-07-26 NOTE — Progress Notes (Signed)
Post Partum Day 1 Subjective: no complaints, up ad lib, voiding and tolerating PO  Objective: Blood pressure 118/70, pulse 78, temperature 98.7 F (37.1 C), temperature source Oral, resp. rate 18, height 5\' 8"  (1.727 m), weight 86.2 kg, last menstrual period 10/27/2017, SpO2 94 %, unknown if currently breastfeeding.  Physical Exam:  General: alert, cooperative and no distress Lochia: appropriate Uterine Fundus: firm DVT Evaluation: No evidence of DVT seen on physical exam.  Recent Labs    07/25/18 0942  HGB 12.7  HCT 37.7    Assessment/Plan: Plan for discharge tomorrow   LOS: 1 day   Scheryl Darter 07/26/2018, 6:54 AM

## 2018-07-27 MED ORDER — IBUPROFEN 600 MG PO TABS: 600.0000 mg | ORAL_TABLET | Freq: Four times a day (QID) | ORAL | 0 refills | Status: DC

## 2018-07-27 MED ORDER — NORGESTIMATE-ETH ESTRADIOL 0.25-35 MG-MCG PO TABS: 1.0000 | ORAL_TABLET | Freq: Every day | ORAL | 11 refills | Status: DC

## 2018-07-27 NOTE — Plan of Care (Signed)
Patient appropriate for discharge.

## 2018-07-27 NOTE — Discharge Instructions (Signed)
Vaginal Delivery, Care After °Refer to this sheet in the next few weeks. These instructions provide you with information about caring for yourself after vaginal delivery. Your health care provider may also give you more specific instructions. Your treatment has been planned according to current medical practices, but problems sometimes occur. Call your health care provider if you have any problems or questions. °What can I expect after the procedure? °After vaginal delivery, it is common to have: °· Some bleeding from your vagina. °· Soreness in your abdomen, your vagina, and the area of skin between your vaginal opening and your anus (perineum). °· Pelvic cramps. °· Fatigue. °Follow these instructions at home: °Medicines °· Take over-the-counter and prescription medicines only as told by your health care provider. °· If you were prescribed an antibiotic medicine, take it as told by your health care provider. Do not stop taking the antibiotic until it is finished. °Driving ° °· Do not drive or operate heavy machinery while taking prescription pain medicine. °· Do not drive for 24 hours if you received a sedative. °Lifestyle °· Do not drink alcohol. This is especially important if you are breastfeeding or taking medicine to relieve pain. °· Do not use tobacco products, including cigarettes, chewing tobacco, or e-cigarettes. If you need help quitting, ask your health care provider. °Eating and drinking °· Drink at least 8 eight-ounce glasses of water every day unless you are told not to by your health care provider. If you choose to breastfeed your baby, you may need to drink more water than this. °· Eat high-fiber foods every day. These foods may help prevent or relieve constipation. High-fiber foods include: °? Whole grain cereals and breads. °? Brown rice. °? Beans. °? Fresh fruits and vegetables. °Activity °· Return to your normal activities as told by your health care provider. Ask your health care provider what  activities are safe for you. °· Rest as much as possible. Try to rest or take a nap when your baby is sleeping. °· Do not lift anything that is heavier than your baby or 10 lb (4.5 kg) until your health care provider says that it is safe. °· Talk with your health care provider about when you can engage in sexual activity. This may depend on your: °? Risk of infection. °? Rate of healing. °? Comfort and desire to engage in sexual activity. °Vaginal Care °· If you have an episiotomy or a vaginal tear, check the area every day for signs of infection. Check for: °? More redness, swelling, or pain. °? More fluid or blood. °? Warmth. °? Pus or a bad smell. °· Do not use tampons or douches until your health care provider says this is safe. °· Watch for any blood clots that may pass from your vagina. These may look like clumps of dark red, brown, or black discharge. °General instructions °· Keep your perineum clean and dry as told by your health care provider. °· Wear loose, comfortable clothing. °· Wipe from front to back when you use the toilet. °· Ask your health care provider if you can shower or take a bath. If you had an episiotomy or a perineal tear during labor and delivery, your health care provider may tell you not to take baths for a certain length of time. °· Wear a bra that supports your breasts and fits you well. °· If possible, have someone help you with household activities and help care for your baby for at least a few days after you   leave the hospital. °· Keep all follow-up visits for you and your baby as told by your health care provider. This is important. °Contact a health care provider if: °· You have: °? Vaginal discharge that has a bad smell. °? Difficulty urinating. °? Pain when urinating. °? A sudden increase or decrease in the frequency of your bowel movements. °? More redness, swelling, or pain around your episiotomy or vaginal tear. °? More fluid or blood coming from your episiotomy or vaginal  tear. °? Pus or a bad smell coming from your episiotomy or vaginal tear. °? A fever. °? A rash. °? Little or no interest in activities you used to enjoy. °? Questions about caring for yourself or your baby. °· Your episiotomy or vaginal tear feels warm to the touch. °· Your episiotomy or vaginal tear is separating or does not appear to be healing. °· Your breasts are painful, hard, or turn red. °· You feel unusually sad or worried. °· You feel nauseous or you vomit. °· You pass large blood clots from your vagina. If you pass a blood clot from your vagina, save it to show to your health care provider. Do not flush blood clots down the toilet without having your health care provider look at them. °· You urinate more than usual. °· You are dizzy or light-headed. °· You have not breastfed at all and you have not had a menstrual period for 12 weeks after delivery. °· You have stopped breastfeeding and you have not had a menstrual period for 12 weeks after you stopped breastfeeding. °Get help right away if: °· You have: °? Pain that does not go away or does not get better with medicine. °? Chest pain. °? Difficulty breathing. °? Blurred vision or spots in your vision. °? Thoughts about hurting yourself or your baby. °· You develop pain in your abdomen or in one of your legs. °· You develop a severe headache. °· You faint. °· You bleed from your vagina so much that you fill two sanitary pads in one hour. °This information is not intended to replace advice given to you by your health care provider. Make sure you discuss any questions you have with your health care provider. °Document Released: 02/29/2000 Document Revised: 08/15/2015 Document Reviewed: 03/18/2015 °Elsevier Interactive Patient Education © 2019 Elsevier Inc. ° °

## 2018-07-29 ENCOUNTER — Encounter: Payer: Medicaid Other | Admitting: Obstetrics and Gynecology

## 2018-08-30 ENCOUNTER — Encounter: Payer: Self-pay | Admitting: Obstetrics

## 2018-08-30 ENCOUNTER — Other Ambulatory Visit: Payer: Self-pay

## 2018-08-30 ENCOUNTER — Telehealth (INDEPENDENT_AMBULATORY_CARE_PROVIDER_SITE_OTHER): Payer: Medicaid Other | Admitting: Obstetrics

## 2018-08-30 DIAGNOSIS — Z3041 Encounter for surveillance of contraceptive pills: Secondary | ICD-10-CM

## 2018-08-30 NOTE — Progress Notes (Signed)
    TELEHEALTH VIRTUAL POSTPARTUM VISIT ENCOUNTER NOTE  I connected with@ on 08/30/18 at  9:00 AM EDT by telephone at home and verified that I am speaking with the correct person using two identifiers.   I discussed the limitations, risks, security and privacy concerns of performing an evaluation and management service by telephone and the availability of in person appointments. I also discussed with the patient that there may be a patient responsible charge related to this service. The patient expressed understanding and agreed to proceed.  Appointment Date: 08/30/2018  OBGYN Clinic: [facility]  Chief Complaint:  Chief Complaint  Patient presents with  . Postpartum Care    History of Present Illness: Shayne Deerman is a 28 y.o. African-American G3P3003 (No LMP recorded.), seen for the above chief complaint. Her past medical history is significant for:  Noncontributory    She is s/p normal spontaneous vaginal delivery on 07/25/2018 at 38.5 weeks; she was discharged to home on 07/27/2018 D#2. Pregnancy complicated by GC, treated. Baby is doing well Unremarkable.  Complains of no complaints  Vaginal bleeding or discharge: No  Mode of feeding infant: Bottle Jerlyn Ly Start Intercourse: Yes  Contraception: OCP given at [facility] PP depression s/s: No .  Any bowel or bladder issues: No  Pap smear: no abnormalities (date: 01/13/2018)  Review of Systems: Positive for none. Her 12 point review of systems is negative or as noted in the History of Present Illness.  Patient Active Problem List   Diagnosis Date Noted  . Gonorrhea affecting pregnancy 07/25/2018  . Group B streptococcal carriage complicating pregnancy 28/31/5176  . Normal labor 07/25/2018  . Anemia affecting pregnancy, antepartum 01/27/2018  . Supervision of other normal pregnancy, antepartum 01/13/2018  . Seasonal rhinitis 06/18/2013    Medications Lizmarie Coots had no medications administered during this visit. Current  Outpatient Medications  Medication Sig Dispense Refill  . acetaminophen (TYLENOL) 325 MG tablet Take 325 mg by mouth every 6 (six) hours as needed for headache.    . ibuprofen (ADVIL) 600 MG tablet Take 1 tablet (600 mg total) by mouth every 6 (six) hours. 30 tablet 0  . norgestimate-ethinyl estradiol (ORTHO-CYCLEN) 0.25-35 MG-MCG tablet Take 1 tablet by mouth daily. Start 6 weeks after delivery 1 Package 11   No current facility-administered medications for this visit.     Allergies Patient has no known allergies.  Physical Exam:  General:  Alert, oriented and cooperative.   Mental Status: Normal mood and affect perceived. Normal judgment and thought content.  Rest of physical exam deferred due to type of encounter  PP Depression Screening:    Assessment:Patient is a 28 y.o. [id number] who is 4 weeks postpartum from a normal spontaneous vaginal delivery.  She is doing well.   Plan: 1. Postpartum care following vaginal delivery - doing well  2. Encounter for surveillance of contraceptive pills - pleased with OCP's   RTC 2 weeks.  I discussed the assessment and treatment plan with the patient. The patient was provided an opportunity to ask questions and all were answered. The patient agreed with the plan and demonstrated an understanding of the instructions.   The patient was advised to call back or seek an in-person evaluation/go to the ED for any concerning postpartum symptoms.  I provided 10 minutes of non-face-to-face time during this encounter.   Tamela Oddi, [place] for [employer], [place] [facility] 08-30-2018

## 2018-09-15 ENCOUNTER — Ambulatory Visit: Payer: Medicaid Other

## 2018-09-22 ENCOUNTER — Other Ambulatory Visit: Payer: Self-pay

## 2018-09-22 ENCOUNTER — Ambulatory Visit (INDEPENDENT_AMBULATORY_CARE_PROVIDER_SITE_OTHER): Payer: Medicaid Other | Admitting: *Deleted

## 2018-09-22 ENCOUNTER — Other Ambulatory Visit (HOSPITAL_COMMUNITY)
Admission: RE | Admit: 2018-09-22 | Discharge: 2018-09-22 | Disposition: A | Discharge status: Home / Self Care | Payer: Medicaid Other | Source: Ambulatory Visit | Attending: Obstetrics and Gynecology | Admitting: Obstetrics and Gynecology

## 2018-09-22 VITALS — BP 120/71 | HR 87

## 2018-09-22 DIAGNOSIS — Z113 Encounter for screening for infections with a predominantly sexual mode of transmission: Secondary | ICD-10-CM | POA: Insufficient documentation

## 2018-09-22 DIAGNOSIS — Z8619 Personal history of other infectious and parasitic diseases: Secondary | ICD-10-CM

## 2018-09-22 NOTE — Progress Notes (Signed)
Pt is in office for STD testing.  Pt had negative testing in May after +GC results in April.   Pt states she would like testing today "just in case".  Pt preformed self swab today and made aware she will be contacted with any abnormal results. Pt made aware to follow up in office as needed.  BP 120/71   Pulse 87

## 2018-09-24 LAB — CERVICOVAGINAL ANCILLARY ONLY
Bacterial vaginitis: NEGATIVE
Candida vaginitis: NEGATIVE
Chlamydia: NEGATIVE
Neisseria Gonorrhea: NEGATIVE
Trichomonas: NEGATIVE

## 2019-03-18 NOTE — L&D Delivery Note (Signed)
Kristen Grimes is a 28 y.o.@ s/p vaginal delivery at [redacted]w[redacted]d.  She was admitted for labor in the setting [facility].    ROM: 0h 53m with clear fluid GBS Status: negative Maximum Maternal Temperature: 98.38F   Labor Progress: Pt in active labor on admission at 6.5/100/-1. Without augmentation she progressed to complete cervical dilation and delivered without complication as noted below.   Delivery Date/Time: 01/13/2020 at 2215 Delivery: Called to room and patient was complete and pushing. Head delivered ROA. No nuchal cord present. Shoulder and body delivered in usual fashion. Infant with spontaneous cry, placed on mother's abdomen, dried and stimulated. Cord clamped x 2 after 1-minute delay, and cut by FOB under my direct supervision. Cord blood drawn. Placenta delivered spontaneously with gentle cord traction. Fundus firm with massage and Pitocin. Labia, perineum, vagina, and cervix were inspected; no lacerations visualized.    Placenta: intact, 3-vessel cord, sent to L&D Complications: none Lacerations: none EBL: 100 ml Analgesia: epidural   Infant: female  APGARs 9 & 9  weight per medical record  Fayette Pho, MD

## 2019-05-17 ENCOUNTER — Other Ambulatory Visit: Payer: Self-pay | Admitting: Advanced Practice Midwife

## 2019-05-17 DIAGNOSIS — Z3480 Encounter for supervision of other normal pregnancy, unspecified trimester: Secondary | ICD-10-CM

## 2019-07-04 ENCOUNTER — Other Ambulatory Visit: Payer: Self-pay

## 2019-07-04 ENCOUNTER — Ambulatory Visit (INDEPENDENT_AMBULATORY_CARE_PROVIDER_SITE_OTHER): Payer: Medicaid Other

## 2019-07-04 VITALS — BP 120/72 | HR 84 | Wt 179.8 lb

## 2019-07-04 DIAGNOSIS — Z3480 Encounter for supervision of other normal pregnancy, unspecified trimester: Secondary | ICD-10-CM | POA: Insufficient documentation

## 2019-07-04 DIAGNOSIS — Z3201 Encounter for pregnancy test, result positive: Secondary | ICD-10-CM | POA: Diagnosis not present

## 2019-07-04 DIAGNOSIS — Z3200 Encounter for pregnancy test, result unknown: Secondary | ICD-10-CM

## 2019-07-04 LAB — POCT URINE PREGNANCY: Preg Test, Ur: POSITIVE — AB

## 2019-07-04 MED ORDER — BLOOD PRESSURE CUFF MISC: 1.0000 | 0 refills | Status: DC

## 2019-07-04 NOTE — Progress Notes (Signed)
PRENATAL INTAKE SUMMARY  Ms. Dicker presents today New OB Nurse Interview.  OB History    Gravida  3   Para  3   Term  3   Preterm      AB      Living  3     SAB      TAB      Ectopic      Multiple  0   Live Births  3          I have reviewed the patient's medical, obstetrical, social, and family histories, medications, and available lab results.  SUBJECTIVE She has no unusual complaints  OBJECTIVE Initial Prenatal Intake (New OB Intake) LMP: unsure, pt reports it may have been between 04/27/19-04/30/19 EDD: around 02/01/20 Will order [place] for dating today, pt made aware.   GENERAL APPEARANCE: alert, well appearing   ASSESSMENT Anticipating normal pregnancy  PLAN  New OB appointment with provider between 10-[redacted] weeks gestation. Pt made aware to schedule appointment.

## 2019-07-04 NOTE — Addendum Note (Signed)
Addended byFrutoso Chase on: 07/04/2019 04:04 PM   Modules accepted: Orders

## 2019-07-08 ENCOUNTER — Other Ambulatory Visit: Payer: Self-pay

## 2019-07-08 ENCOUNTER — Ambulatory Visit (HOSPITAL_COMMUNITY)
Admission: EM | Admit: 2019-07-08 | Discharge: 2019-07-08 | Disposition: A | Discharge status: Home / Self Care | Payer: Medicaid Other | Attending: Family Medicine | Admitting: Family Medicine

## 2019-07-08 ENCOUNTER — Encounter (HOSPITAL_COMMUNITY): Payer: Self-pay | Admitting: Family Medicine

## 2019-07-08 DIAGNOSIS — B029 Zoster without complications: Secondary | ICD-10-CM

## 2019-07-08 MED ORDER — ACETAMINOPHEN 325 MG PO TABS: 650.0000 mg | ORAL_TABLET | Freq: Four times a day (QID) | ORAL | 0 refills | Status: DC | PRN

## 2019-07-08 MED ORDER — VALACYCLOVIR HCL 1 G PO TABS: 1000.0000 mg | ORAL_TABLET | Freq: Three times a day (TID) | ORAL | 0 refills | Status: AC

## 2019-07-08 NOTE — ED Triage Notes (Signed)
Pt c/o pain, swelling and rash to R eye x 2 days

## 2019-07-08 NOTE — ED Provider Notes (Signed)
[facility]    CSN: [id number] Arrival date & time: 07/08/19  1035      History   Chief Complaint Chief Complaint  Patient presents with  . Eye Pain    HPI Kristen Grimes is a 29 y.o. female.   Pt is a 29 year old female that presents with c/o pain and and rash to right upper eye lid x 2 days. Symptoms have been constant. No pain in the eye, fever. No injury to the eye. No swelling or drainage.      Past Medical History:  Diagnosis Date  . Medical history non-contributory     Patient Active Problem List   Diagnosis Date Noted  . Supervision of other normal pregnancy, antepartum 07/04/2019  . Gonorrhea affecting pregnancy 07/25/2018  . Group B streptococcal carriage complicating pregnancy 07/25/2018  . Normal labor 07/25/2018  . Anemia affecting pregnancy, antepartum 01/27/2018  . Seasonal rhinitis 06/18/2013    Past Surgical History:  Procedure Laterality Date  . NO PAST SURGERIES      OB History    Gravida  4   Para  3   Term  3   Preterm      AB      Living  3     SAB      TAB      Ectopic      Multiple  0   Live Births  3            Home Medications    Prior to Admission medications   Medication Sig Start Date End Date Taking? Authorizing Provider  acetaminophen (TYLENOL) 325 MG tablet Take 2 tablets (650 mg total) by mouth every 6 (six) hours as needed for headache. 07/08/19   Dahlia Byes A, NP  Blood Pressure Monitoring (BLOOD PRESSURE CUFF) MISC 1 Device by Does not apply route once a week. 07/04/19   Brock Bad, MD  ibuprofen (ADVIL) 600 MG tablet Take 1 tablet (600 mg total) by mouth every 6 (six) hours. Patient not taking: Reported on 07/04/2019 07/27/18   Tereso Newcomer, MD  norgestimate-ethinyl estradiol (ORTHO-CYCLEN) 0.25-35 MG-MCG tablet Take 1 tablet by mouth daily. Start 6 weeks after delivery Patient not taking: Reported on 07/04/2019 07/27/18   Tereso Newcomer, MD  Prenat-Fe Poly-Methfol-FA-DHA  (VITAFOL ULTRA) 29-0.6-0.4-200 MG CAPS TAKE 1 CAPSULE BY MOUTH  DAILY 05/17/19   Katrinka Blazing, [place], CNM  valACYclovir (VALTREX) 1000 MG tablet Take 1 tablet (1,000 mg total) by mouth 3 (three) times daily for 7 days. 07/08/19 07/15/19  Janace Aris, NP    Family History Family History  Problem Relation Age of Onset  . Multiple sclerosis Mother     Social History Social History   Tobacco Use  . Smoking status: Never Smoker  . Smokeless tobacco: Never Used  Substance Use Topics  . Alcohol use: Not Currently    Comment: occ wine- stop with pregnancy  . Drug use: No     Allergies   Patient has no known allergies.   Review of Systems Review of Systems   Physical Exam Triage Vital Signs ED Triage Vitals  Enc Vitals Group     BP 07/08/19 1131 134/73     Pulse Rate 07/08/19 1131 81     Resp 07/08/19 1131 16     Temp 07/08/19 1131 98 F (36.7 C)     Temp src --      SpO2 07/08/19 1131 100 %  Weight --      Height --      Head Circumference --      Peak Flow --      Pain Score 07/08/19 1132 0     Pain Loc --      Pain Edu? --      Excl. in [place]? --    No data found.  Updated Vital Signs BP 134/73   Pulse 81   Temp 98 F (36.7 C)   Resp 16   LMP 04/27/2019 (LMP Unknown)   SpO2 100%   Visual Acuity Right Eye Distance:   Left Eye Distance:   Bilateral Distance:    Right Eye Near:   Left Eye Near:    Bilateral Near:     Physical Exam Vitals and nursing note reviewed.  Constitutional:      General: She is not in acute distress.    Appearance: Normal appearance. She is not ill-appearing, toxic-appearing or diaphoretic.  HENT:     Head: Normocephalic.     Nose: Nose normal.  Eyes:     Conjunctiva/sclera: Conjunctivae normal.  Pulmonary:     Effort: Pulmonary effort is normal.  Musculoskeletal:        General: Normal range of motion.     Cervical back: Normal range of motion.  Skin:    General: Skin is warm and dry.     Findings: Rash present.      Comments: Papular vesicular rash to right upper lid.  No eye involvement.   Neurological:     Mental Status: She is alert.  Psychiatric:        Mood and Affect: Mood normal.        UC Treatments / Results  Labs (all labs ordered are listed, but only abnormal results are displayed) Labs Reviewed - No data to display  EKG   Radiology No results found.  Procedures Procedures (including critical care time)  Medications Ordered in UC Medications - No data to display  Initial Impression / Assessment and Plan / UC Course  I have reviewed the triage vital signs and the nursing notes.  Pertinent labs & imaging results that were available during my care of the patient were reviewed by me and considered in my medical decision making (see chart for details).     Herpes zoster Treating with valtrex Tylenol for pain  Follow up as needed for continued or worsening symptoms  Final Clinical Impressions(s) / UC Diagnoses   Final diagnoses:  Herpes zoster without complication     Discharge Instructions     I believe this is most likely shingles Take the medication as prescribed. You can take Tylenol every 8 hours for pain If this worsens or starts to spread into the eye or you start having trouble with your vision please follow-up Otherwise see your OB on Monday.     ED Prescriptions    Medication Sig Dispense Auth. Provider   valACYclovir (VALTREX) 1000 MG tablet Take 1 tablet (1,000 mg total) by mouth 3 (three) times daily for 7 days. 21 tablet Terris Bodin A, NP   acetaminophen (TYLENOL) 325 MG tablet Take 2 tablets (650 mg total) by mouth every 6 (six) hours as needed for headache. 30 tablet Loura Halt A, NP     PDMP not reviewed this encounter.   Loura Halt A, NP 07/11/19 [phone]

## 2019-07-08 NOTE — Discharge Instructions (Addendum)
I believe this is most likely shingles Take the medication as prescribed. You can take Tylenol every 8 hours for pain If this worsens or starts to spread into the eye or you start having trouble with your vision please follow-up Otherwise see your OB on Monday.

## 2019-07-11 ENCOUNTER — Ambulatory Visit (HOSPITAL_COMMUNITY)
Admission: RE | Admit: 2019-07-11 | Discharge: 2019-07-11 | Disposition: A | Discharge status: Home / Self Care | Payer: Medicaid Other | Source: Ambulatory Visit | Attending: Obstetrics | Admitting: Obstetrics

## 2019-07-11 ENCOUNTER — Other Ambulatory Visit: Payer: Self-pay | Admitting: Obstetrics

## 2019-07-11 ENCOUNTER — Other Ambulatory Visit: Payer: Self-pay

## 2019-07-11 DIAGNOSIS — Z3200 Encounter for pregnancy test, result unknown: Secondary | ICD-10-CM | POA: Diagnosis present

## 2019-07-13 ENCOUNTER — Other Ambulatory Visit: Payer: Self-pay

## 2019-07-13 ENCOUNTER — Ambulatory Visit (INDEPENDENT_AMBULATORY_CARE_PROVIDER_SITE_OTHER): Payer: Medicaid Other | Admitting: Certified Nurse Midwife

## 2019-07-13 ENCOUNTER — Encounter: Payer: Self-pay | Admitting: Certified Nurse Midwife

## 2019-07-13 ENCOUNTER — Other Ambulatory Visit (HOSPITAL_COMMUNITY)
Admission: RE | Admit: 2019-07-13 | Discharge: 2019-07-13 | Disposition: A | Discharge status: Home / Self Care | Payer: Medicaid Other | Source: Ambulatory Visit | Attending: Certified Nurse Midwife | Admitting: Certified Nurse Midwife

## 2019-07-13 VITALS — BP 122/66 | HR 85 | Wt 177.0 lb

## 2019-07-13 DIAGNOSIS — Z3480 Encounter for supervision of other normal pregnancy, unspecified trimester: Secondary | ICD-10-CM | POA: Diagnosis present

## 2019-07-13 DIAGNOSIS — O09899 Supervision of other high risk pregnancies, unspecified trimester: Secondary | ICD-10-CM | POA: Insufficient documentation

## 2019-07-13 DIAGNOSIS — Z3481 Encounter for supervision of other normal pregnancy, first trimester: Secondary | ICD-10-CM | POA: Diagnosis not present

## 2019-07-13 DIAGNOSIS — B029 Zoster without complications: Secondary | ICD-10-CM

## 2019-07-13 MED ORDER — VITAFOL ULTRA 29-0.6-0.4-200 MG PO CAPS: 1.0000 | ORAL_CAPSULE | Freq: Every day | ORAL | 12 refills | Status: DC

## 2019-07-13 NOTE — Progress Notes (Signed)
NOB in office, New OB intake completed on 07-04-19. Pt reports light pain on left side that comes and goes.

## 2019-07-13 NOTE — Progress Notes (Signed)
History:   Kristen Grimes is a 29 y.o. G4P3003 at [redacted]w[redacted]d by LMP being seen today for her first obstetrical visit.  Her obstetrical history is significant for close spaced pregnancies. Patient does intend to breast feed. Pregnancy history fully reviewed. This is not a planned pregnancy but she is coping with pregnancy well and has support system in place.   Patient reports nausea and cramping.     HISTORY: OB History  Gravida Para Term Preterm AB Living  4 3 3  0 0 3  SAB TAB Ectopic Multiple Live Births  0 0 0 0 3    # Outcome Date GA Lbr Len/2nd Weight Sex Delivery Anes PTL Lv  4 Current           3 Term 07/25/18 [redacted]w[redacted]d 12:06 / 00:11 6 lb 13 oz (3.09 kg) F Vag-Spont None  LIV     Name: Kristen Grimes     Apgar1: 9  Apgar5: 9  2 Term 11/16/13 [redacted]w[redacted]d -21:07 / 00:01 6 lb 15.1 oz (3.15 kg) F Vag-Spont None  LIV     Name: Kristen Grimes     Apgar1: 9  Apgar5: 9  1 Term 07/31/10 [redacted]w[redacted]d   M Vag-Spont None N LIV     Name: Kristen Grimes    Last pap smear was done 12/2017 and was normal  Past Medical History:  Diagnosis Date  . Anemia affecting pregnancy, antepartum 01/27/2018  . Gonorrhea affecting pregnancy 07/25/2018  . Group B streptococcal carriage complicating pregnancy 07/25/2018  . Medical history non-contributory    Past Surgical History:  Procedure Laterality Date  . NO PAST SURGERIES     Family History  Problem Relation Age of Onset  . Multiple sclerosis Mother    Social History   Tobacco Use  . Smoking status: Never Smoker  . Smokeless tobacco: Never Used  Substance Use Topics  . Alcohol use: Not Currently    Comment: occ wine- stop with pregnancy  . Drug use: No   No Known Allergies Current Outpatient Medications on File Prior to Visit  Medication Sig Dispense Refill  . acetaminophen (TYLENOL) 325 MG tablet Take 2 tablets (650 mg total) by mouth every 6 (six) hours as needed for headache. 30 tablet 0  . Blood Pressure Monitoring (BLOOD PRESSURE CUFF)  MISC 1 Device by Does not apply route once a week. 1 each 0  . valACYclovir (VALTREX) 1000 MG tablet Take 1 tablet (1,000 mg total) by mouth 3 (three) times daily for 7 days. 21 tablet 0  . ibuprofen (ADVIL) 600 MG tablet Take 1 tablet (600 mg total) by mouth every 6 (six) hours. (Patient not taking: Reported on 07/04/2019) 30 tablet 0   No current facility-administered medications on file prior to visit.    Review of Systems Pertinent items noted in HPI and remainder of comprehensive ROS otherwise negative. Physical Exam:   Vitals:   07/13/19 0955  BP: 122/66  Pulse: 85  Weight: 177 lb (80.3 kg)   Fetal Heart Rate (bpm): 156 Uterus:     Pelvic Exam: Perineum: no hemorrhoids, normal perineum   Vulva: normal external genitalia, no lesions   Adnexa: normal adnexa and no mass, fullness, tenderness   Bony Pelvis: average  System: General: well-developed, well-nourished female in no acute distress   Breasts:  normal appearance, no masses or tenderness bilaterally   Skin: normal coloration and turgor, no rashes   Neurologic: oriented, normal, negative, normal mood   Extremities: normal strength, tone, and muscle mass,  ROM of all joints is normal   HEENT PERRLA, extraocular movement intact and sclera clear, anicteric, papular vesicular rash to right upper lid   Mouth/Teeth mucous membranes moist, pharynx normal without lesions and dental hygiene good   Neck supple and no masses   Cardiovascular: regular rate and rhythm   Respiratory:  no respiratory distress, normal breath sounds   Abdomen: soft, non-tender; bowel sounds normal; no masses,  no organomegaly     Assessment:    Pregnancy: [id number] Patient Active Problem List   Diagnosis Date Noted  . [place] interval between pregnancies affecting pregnancy, antepartum 07/13/2019  . Supervision of other normal pregnancy, antepartum 07/04/2019  . Normal labor 07/25/2018  . Seasonal rhinitis 06/18/2013     Plan:    1. Supervision of  other normal pregnancy, antepartum - Reviewed safety, visitor policy, reassurance about COVID-19 for pregnancy at this time. Discussed possible changes to visits, including televisits, that may occur due to COVID-19.  The office remains open if pt needs to be seen and [facility] is open 24 hours/day for OB emergencies. - Discussed babyscripts app and need for patient to call customer service number to switch app to new pregnancy, patient verbalizes understanding  - Routine prenatal care - Anticipatory guidance on upcoming appointments including [place] visit for next appointment  - Cervicovaginal ancillary only( [place]) - Culture, OB Urine - Hepatitis C Antibody - Obstetric Panel, Including HIV - Babyscripts Schedule Optimization - Genetic Screening - US MFM OB COMP + 14 WK; Future - Prenat-Fe Poly-Methfol-FA-DHA (VITAFOL ULTRA) 29-0.6-0.4-200 MG CAPS; Take 1 capsule by mouth daily.  Dispense: 30 capsule; Refill: 12  2. [place] interval between pregnancies affecting pregnancy, antepartum - Patient had recent delivery 07/2018 - Was on OCPs for birth control, patient reports maybe missing one pill then getting pregnancy  - Educated and discussed birth control options with patient after delivery including LARC methods, patient has been on Depo and Nexplanon in past  - Discussed IUD with patient and brochure given, patient agrees to postplacental IUD as she does not want to become pregnant again anytime soon.   3. Herpes zoster without complications - patient was recently seen at urgent care for eye pain, papular vesicular rash to right upper lid - diagnosed with shingles and given Valtrex for treatment    Initial labs drawn. Continue prenatal vitamins. Refill Rx sent to pharmacy of choice  Genetic Screening discussed, NIPS: ordered. Ultrasound discussed; fetal anatomic survey: ordered. Problem list reviewed and updated. The nature of [place] with multiple  MDs and other Advanced Practice Providers was explained to patient; also emphasized that residents, students are part of our team. Routine obstetric precautions reviewed. Return in about 29 days (around 08/11/2019) for ROB-mychart.     Lajean Manes, [place] for [employer], [place]

## 2019-07-13 NOTE — Patient Instructions (Signed)

## 2019-07-14 LAB — OBSTETRIC PANEL, INCLUDING HIV
Antibody Screen: NEGATIVE
Basophils Absolute: 0 10*3/uL (ref 0.0–0.2)
Basos: 1 %
EOS (ABSOLUTE): 0 10*3/uL (ref 0.0–0.4)
Eos: 1 %
HIV Screen 4th Generation wRfx: NONREACTIVE
Hematocrit: 36.4 % (ref 34.0–46.6)
Hemoglobin: 12.7 g/dL (ref 11.1–15.9)
Hepatitis B Surface Ag: NEGATIVE
Immature Grans (Abs): 0 10*3/uL (ref 0.0–0.1)
Immature Granulocytes: 0 %
Lymphocytes Absolute: 2.8 10*3/uL (ref 0.7–3.1)
Lymphs: 44 %
MCH: 31.5 pg (ref 26.6–33.0)
MCHC: 34.9 g/dL (ref 31.5–35.7)
MCV: 90 fL (ref 79–97)
Monocytes Absolute: 0.6 10*3/uL (ref 0.1–0.9)
Monocytes: 9 %
Neutrophils Absolute: 2.9 10*3/uL (ref 1.4–7.0)
Neutrophils: 45 %
Platelets: 286 10*3/uL (ref 150–450)
RBC: 4.03 x10E6/uL (ref 3.77–5.28)
RDW: 13.9 % (ref 11.7–15.4)
RPR Ser Ql: NONREACTIVE
Rh Factor: POSITIVE
Rubella Antibodies, IGG: 3.38 index (ref >0.99)
WBC: 6.3 10*3/uL (ref 3.4–10.8)

## 2019-07-14 LAB — HEPATITIS C ANTIBODY: Hep C Virus Ab: <0.1 s/co ratio (ref 0.0–0.9)

## 2019-07-14 LAB — CERVICOVAGINAL ANCILLARY ONLY
Chlamydia: NEGATIVE
Comment: NEGATIVE
Comment: NEGATIVE
Comment: NORMAL
Neisseria Gonorrhea: NEGATIVE
Trichomonas: NEGATIVE

## 2019-07-15 LAB — URINE CULTURE, OB REFLEX

## 2019-07-15 LAB — CULTURE, OB URINE

## 2019-07-18 ENCOUNTER — Encounter: Payer: Self-pay | Admitting: Obstetrics and Gynecology

## 2019-07-25 ENCOUNTER — Encounter: Payer: Self-pay | Admitting: Obstetrics and Gynecology

## 2019-08-11 ENCOUNTER — Encounter: Payer: Self-pay | Admitting: Certified Nurse Midwife

## 2019-08-11 ENCOUNTER — Telehealth (INDEPENDENT_AMBULATORY_CARE_PROVIDER_SITE_OTHER): Payer: Medicaid Other | Admitting: Certified Nurse Midwife

## 2019-08-11 DIAGNOSIS — Z3480 Encounter for supervision of other normal pregnancy, unspecified trimester: Secondary | ICD-10-CM

## 2019-08-11 DIAGNOSIS — O09899 Supervision of other high risk pregnancies, unspecified trimester: Secondary | ICD-10-CM

## 2019-08-11 DIAGNOSIS — O09892 Supervision of other high risk pregnancies, second trimester: Secondary | ICD-10-CM

## 2019-08-11 DIAGNOSIS — Z3A18 18 weeks gestation of pregnancy: Secondary | ICD-10-CM

## 2019-08-11 NOTE — Progress Notes (Signed)
   OBSTETRICS PRENATAL VIRTUAL VISIT ENCOUNTER NOTE  Provider location: Center for [facility]   I connected with Kristen Grimes on 08/11/19 at  8:35 AM EDT by MyChart Video Encounter at home and verified that I am speaking with the correct person using two identifiers.   I discussed the limitations, risks, security and privacy concerns of performing an evaluation and management service virtually and the availability of in person appointments. I also discussed with the patient that there may be a patient responsible charge related to this service. The patient expressed understanding and agreed to proceed. Subjective:  Kristen Grimes is a 29 y.o. G4P3003 at [redacted]w[redacted]d being seen today for ongoing prenatal care.  She is currently monitored for the following issues for this low-risk pregnancy and has Seasonal rhinitis; Normal labor; Supervision of other normal pregnancy, antepartum; and Podgorski interval between pregnancies affecting pregnancy, antepartum on their problem list.  Patient reports toothache.  Contractions: Not present. Vag. Bleeding: None.  Movement: Present. Denies any leaking of fluid.   The following portions of the patient's history were reviewed and updated as appropriate: allergies, current medications, past family history, past medical history, past social history, past surgical history and problem list.   Objective:   Vitals:   08/11/19 0823  BP: 124/70  Pulse: (!) 108    Fetal Status:     Movement: Present     General:  Alert, oriented and cooperative. Patient is in no acute distress.  Respiratory: Normal respiratory effort, no problems with respiration noted  Mental Status: Normal mood and affect. Normal behavior. Normal judgment and thought content.  Rest of physical exam deferred due to type of encounter  Imaging: No results found.  Assessment and Plan:  Pregnancy: G4P3003 at [redacted]w[redacted]d 1. Shuman interval between pregnancies affecting pregnancy,  antepartum  2. Supervision of other normal pregnancy, antepartum - Patient doing well, reports occasional toothache- thinks its her wisdom tooth, encouraged to be seen by dentist to assess   - encouraged patient to call office to get dental letter if needed  - Educated and discussed fetal movement during pregnancy  - routine prenatal care - anticipatory guidance on upcoming appointment including in person visit for AFP  - Discussed hydration during pregnancy and to increase amount of water she consumes    Preterm labor symptoms and general obstetric precautions including but not limited to vaginal bleeding, contractions, leaking of fluid and fetal movement were reviewed in detail with the patient. I discussed the assessment and treatment plan with the patient. The patient was provided an opportunity to ask questions and all were answered. The patient agreed with the plan and demonstrated an understanding of the instructions. The patient was advised to call back or seek an in-person office evaluation/go to [facility] at [facility] for any urgent or concerning symptoms. Please refer to After Visit Summary for other counseling recommendations.   I provided 10 minutes of face-to-face time during this encounter.  Return in about 4 weeks (around 09/08/2019) for ROB/AFP.  Future Appointments  Date Time Provider Department Center  09/01/2019  9:45 AM [facility] US4 [facility]    Sharyon Cable, CNM Center for [employer], [facility]

## 2019-08-11 NOTE — Patient Instructions (Signed)

## 2019-08-11 NOTE — Progress Notes (Signed)
Virtual ROB   CC: Toothache

## 2019-08-17 ENCOUNTER — Ambulatory Visit: Payer: Medicaid Other

## 2019-09-01 ENCOUNTER — Other Ambulatory Visit: Payer: Self-pay

## 2019-09-01 ENCOUNTER — Other Ambulatory Visit: Payer: Self-pay | Admitting: *Deleted

## 2019-09-01 ENCOUNTER — Ambulatory Visit: Payer: Medicaid Other | Attending: Certified Nurse Midwife

## 2019-09-01 ENCOUNTER — Ambulatory Visit: Payer: Medicaid Other | Admitting: *Deleted

## 2019-09-01 DIAGNOSIS — Z3A21 21 weeks gestation of pregnancy: Secondary | ICD-10-CM

## 2019-09-01 DIAGNOSIS — O09899 Supervision of other high risk pregnancies, unspecified trimester: Secondary | ICD-10-CM | POA: Diagnosis present

## 2019-09-01 DIAGNOSIS — Z148 Genetic carrier of other disease: Secondary | ICD-10-CM | POA: Diagnosis not present

## 2019-09-01 DIAGNOSIS — O09892 Supervision of other high risk pregnancies, second trimester: Secondary | ICD-10-CM

## 2019-09-01 DIAGNOSIS — Z3480 Encounter for supervision of other normal pregnancy, unspecified trimester: Secondary | ICD-10-CM | POA: Diagnosis not present

## 2019-09-01 DIAGNOSIS — Z363 Encounter for antenatal screening for malformations: Secondary | ICD-10-CM

## 2019-09-08 ENCOUNTER — Ambulatory Visit (INDEPENDENT_AMBULATORY_CARE_PROVIDER_SITE_OTHER): Payer: Medicaid Other

## 2019-09-08 ENCOUNTER — Other Ambulatory Visit: Payer: Self-pay

## 2019-09-08 VITALS — BP 120/66 | HR 85 | Wt 184.0 lb

## 2019-09-08 DIAGNOSIS — Z3A22 22 weeks gestation of pregnancy: Secondary | ICD-10-CM

## 2019-09-08 DIAGNOSIS — Z3482 Encounter for supervision of other normal pregnancy, second trimester: Secondary | ICD-10-CM

## 2019-09-08 DIAGNOSIS — Z3480 Encounter for supervision of other normal pregnancy, unspecified trimester: Secondary | ICD-10-CM

## 2019-09-08 NOTE — Progress Notes (Signed)
   PRENATAL VISIT NOTE  Subjective:  Kristen Grimes is a 29 y.o. G4P3003 at 110w4d who presents today for routine prenatal care.  She is currently being monitored for supervision of a low-risk pregnancy with problems as listed below.  Patient has no pregnancy related concerns and endorses fetal movement.  She denies vaginal concerns including discharge, bleeding, leaking, itching, and burning. She also denies abdominal pain and cramping.   Patient Active Problem List   Diagnosis Date Noted  . Mcveigh interval between pregnancies affecting pregnancy, antepartum 07/13/2019  . Supervision of other normal pregnancy, antepartum 07/04/2019  . Normal labor 07/25/2018  . Seasonal rhinitis 06/18/2013    The following portions of the patient's history were reviewed and updated as appropriate: allergies, current medications, past family history, past medical history, past social history, past surgical history and problem list. Problem list updated.  Objective:   Vitals:   09/08/19 1010  BP: 120/66  Pulse: 85  Weight: 184 lb (83.5 kg)    Fetal Status: Fetal Heart Rate (bpm): 140 Fundal Height: 24 cm Movement: Present     General:  Alert, oriented and cooperative. Patient is in no acute distress.  Skin: Skin is warm and dry.   Cardiovascular: Regular rate and rhythm.  Respiratory: Normal respiratory effort. CTA-Bilaterally  Abdomen: Soft, gravid, appropriate for gestational age.  Pelvic: Cervical exam deferred        Extremities: Normal range of motion.  Edema: None  Mental Status: Normal mood and affect. Normal behavior. Normal judgment and thought content.   Assessment and Plan:  Pregnancy: G4P3003 at [redacted]w[redacted]d  1. Supervision of other normal pregnancy, antepartum -Anticipatory guidance for upcoming appts.  -Reviewed Glucola appt preparation including fasting the night before and morning of.   -Discussed anticipated office time of 2.5-3 hours.  -Reviewed blood draw procedures and labs which  also include check of iron level.  -Discussed how results of GTT are handled including diabetic education and BS testing for abnormal results and routine care for normal results.  -Reviewed desires for birth control method including PPIUD. Concerns addressed.  Patient remains undecided.  -Discussed testing today and how results will be handled. - AFP, Serum, Open Spina Bifida   Preterm labor symptoms and general obstetric precautions including but not limited to vaginal bleeding, contractions, leaking of fluid and fetal movement were reviewed with the patient.  Please refer to After Visit Summary for other counseling recommendations.  Return in about 5 weeks (around 10/13/2019) for LROB with GTT.  Future Appointments  Date Time Provider Department Center  09/15/2019 10:30 AM [facility] GENETIC COUNSELING RM [facility]    Cherre Robins, CNM 09/08/2019, 10:40 AM

## 2019-09-08 NOTE — Progress Notes (Signed)
Pt is here for ROB, [redacted]w[redacted]d.  

## 2019-09-08 NOTE — Patient Instructions (Signed)
Contraception Choices Contraception, also called birth control, refers to methods or devices that prevent pregnancy. Hormonal methods Contraceptive implant  A contraceptive implant is a thin, plastic tube that contains a hormone. It is inserted into the upper part of the arm. It can remain in place for up to 3 years. Progestin-only injections Progestin-only injections are injections of progestin, a synthetic form of the hormone progesterone. They are given every 3 months by a health care provider. Birth control pills  Birth control pills are pills that contain hormones that prevent pregnancy. They must be taken once a day, preferably at the same time each day. Birth control patch  The birth control patch contains hormones that prevent pregnancy. It is placed on the skin and must be changed once a week for three weeks and removed on the fourth week. A prescription is needed to use this method of contraception. Vaginal ring  A vaginal ring contains hormones that prevent pregnancy. It is placed in the vagina for three weeks and removed on the fourth week. After that, the process is repeated with a new ring. A prescription is needed to use this method of contraception. Emergency contraceptive Emergency contraceptives prevent pregnancy after unprotected sex. They come in pill form and can be taken up to 5 days after sex. They work best the sooner they are taken after having sex. Most emergency contraceptives are available without a prescription. This method should not be used as your only form of birth control. Barrier methods Female condom  A female condom is a thin sheath that is worn over the penis during sex. Condoms keep sperm from going inside a woman's body. They can be used with a spermicide to increase their effectiveness. They should be disposed after a single use. Female condom  A female condom is a soft, loose-fitting sheath that is put into the vagina before sex. The condom keeps sperm  from going inside a woman's body. They should be disposed after a single use. Diaphragm  A diaphragm is a soft, dome-shaped barrier. It is inserted into the vagina before sex, along with a spermicide. The diaphragm blocks sperm from entering the uterus, and the spermicide kills sperm. A diaphragm should be left in the vagina for 6-8 hours after sex and removed within 24 hours. A diaphragm is prescribed and fitted by a health care provider. A diaphragm should be replaced every 1-2 years, after giving birth, after gaining more than 15 lb (6.8 kg), and after pelvic surgery. Cervical cap  A cervical cap is a round, soft latex or plastic cup that fits over the cervix. It is inserted into the vagina before sex, along with spermicide. It blocks sperm from entering the uterus. The cap should be left in place for 6-8 hours after sex and removed within 48 hours. A cervical cap must be prescribed and fitted by a health care provider. It should be replaced every 2 years. Sponge  A sponge is a soft, circular piece of polyurethane foam with spermicide on it. The sponge helps block sperm from entering the uterus, and the spermicide kills sperm. To use it, you make it wet and then insert it into the vagina. It should be inserted before sex, left in for at least 6 hours after sex, and removed and thrown away within 30 hours. Spermicides Spermicides are chemicals that kill or block sperm from entering the cervix and uterus. They can come as a cream, jelly, suppository, foam, or tablet. A spermicide should be inserted into the   vagina with an applicator at least 10-15 minutes before sex to allow time for it to work. The process must be repeated every time you have sex. Spermicides do not require a prescription. Intrauterine contraception Intrauterine device (IUD) An IUD is a T-shaped device that is put in a woman's uterus. There are two types:  Hormone IUD.This type contains progestin, a synthetic form of the hormone  progesterone. This type can stay in place for 3-5 years.  Copper IUD.This type is wrapped in copper wire. It can stay in place for 10 years.  Permanent methods of contraception Female tubal ligation In this method, a woman's fallopian tubes are sealed, tied, or blocked during surgery to prevent eggs from traveling to the uterus. Hysteroscopic sterilization In this method, a small, flexible insert is placed into each fallopian tube. The inserts cause scar tissue to form in the fallopian tubes and block them, so sperm cannot reach an egg. The procedure takes about 3 months to be effective. Another form of birth control must be used during those 3 months. Female sterilization This is a procedure to tie off the tubes that carry sperm (vasectomy). After the procedure, the man can still ejaculate fluid (semen). Natural planning methods Natural family planning In this method, a couple does not have sex on days when the woman could become pregnant. Calendar method This means keeping track of the length of each menstrual cycle, identifying the days when pregnancy can happen, and not having sex on those days. Ovulation method In this method, a couple avoids sex during ovulation. Symptothermal method This method involves not having sex during ovulation. The woman typically checks for ovulation by watching changes in her temperature and in the consistency of cervical mucus. Post-ovulation method In this method, a couple waits to have sex until after ovulation. Summary  Contraception, also called birth control, means methods or devices that prevent pregnancy.  Hormonal methods of contraception include implants, injections, pills, patches, vaginal rings, and emergency contraceptives.  Barrier methods of contraception can include female condoms, female condoms, diaphragms, cervical caps, sponges, and spermicides.  There are two types of IUDs (intrauterine devices). An IUD can be put in a woman's uterus to  prevent pregnancy for 3-5 years.  Permanent sterilization can be done through a procedure for males, females, or both.  Natural family planning methods involve not having sex on days when the woman could become pregnant. This information is not intended to replace advice given to you by your health care provider. Make sure you discuss any questions you have with your health care provider. Document Revised: 03/05/2017 Document Reviewed: 04/05/2016 Elsevier Patient Education  2020 Elsevier Inc.  

## 2019-09-10 LAB — AFP, SERUM, OPEN SPINA BIFIDA
AFP MoM: 0.8
AFP Value: 63.6 ng/mL
Gest. Age on Collection Date: 22.6 weeks
Maternal Age At EDD: 26.9 yr
OSBR Risk 1 IN: 10000
Test Results:: NEGATIVE
Weight: 184 [lb_av]

## 2019-09-15 ENCOUNTER — Ambulatory Visit: Payer: Self-pay

## 2019-09-15 ENCOUNTER — Other Ambulatory Visit: Payer: Self-pay

## 2019-09-15 ENCOUNTER — Ambulatory Visit: Payer: Medicaid Other | Attending: Obstetrics and Gynecology | Admitting: Genetic Counselor

## 2019-09-15 DIAGNOSIS — Z3A23 23 weeks gestation of pregnancy: Secondary | ICD-10-CM | POA: Diagnosis not present

## 2019-09-15 DIAGNOSIS — Z315 Encounter for genetic counseling: Secondary | ICD-10-CM | POA: Diagnosis not present

## 2019-09-15 DIAGNOSIS — Z148 Genetic carrier of other disease: Secondary | ICD-10-CM | POA: Diagnosis not present

## 2019-09-15 NOTE — Progress Notes (Signed)
09/15/2019  Kristen Grimes 21-Nov-1990 MRN: [id number] DOV: 09/15/2019  Ms. 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 Ms. Kristen Grimes presented to her appointment alone.   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 Kristen Grimes is a [id number], 29 y.o. female. Her current pregnancy has completed [redacted]w[redacted]d (Estimated Date of Delivery: 01/08/20). Ms. Kristen Grimes has one son from a prior relationship and two daughters with her current partner.   Ms. Kristen Grimes denied exposure to environmental toxins or chemical agents. She denied the use of alcohol, tobacco or street drugs. She reported taking Vitafol. She denied significant viral illnesses, fevers, and bleeding during the course of her pregnancy. Her medical and surgical histories were noncontributory.  Family History  A three generation pedigree was drafted and reviewed. The family history is remarkable for the following:  - Ms. Kristen Grimes's mother has multiple sclerosis (MS). Ms. Kristen Grimes inquired if this could be related to her carrier screening finding. We discussed that MS and spinal muscular atrophy are unrelated to one another. While both conditions affect nerve cells, MS is considered an autoimmune condition whereas SMA is a genetic condition. We discussed that autoimmune conditions occur when an individual's body launches an abnormal immune response and begins to destroy its own cells. There are several different autoimmune conditions. While we do know that autoimmune conditions tend to "cluster" within a family, they do not follow a clear pattern of inheritance. When there is a person in the family with an autoimmune condition, there is an increased chance for others in the family to develop an autoimmune condition, but it may be a different condition. Specific risk factor information is not  available. Genetic testing is not available at this time to predict who may develop an autoimmune condition such as MS.  - Ms. Kristen Grimes has a maternal half sister whose daughter carries sickle cell trait. This child's father (not related to Ms. Kristen Grimes by blood) is also reportedly a carrier for sickle cell trait. We discussed that Ms. Kristen Grimes had carrier screening performed for several conditions, including hemoglobinopathies such as sickle cell disease. Her carrier screening did not identify her as a carrier of a hemoglobinopathy; thus, her risk of having a child with sickle cell disease is significantly reduced.  The remaining family histories were reviewed and found to be noncontributory for birth defects, intellectual disability, recurrent pregnancy loss, and known genetic conditions. [name] had limited information about parts of her family history and her partner's family history; thus, risk assessment was limited.  The patient's ethnicity is African American. The father of the pregnancy's ethnicity is African American. Ashkenazi Jewish ancestry and consanguinity were denied. Pedigree will be scanned under Media.  Discussion  Spinal muscular atrophy:  Ms. Kristen Grimes had Horizon-14 carrier screening performed through [place]. Ms. Kristen Grimes was found to have 2 copies of the SMN1 gene on Horizon-14 carrier screening; however, she also has the c.*3+80T>G polymorphism of SMN1 in intron 7 (also known as [id number]). This puts her at increased risk (1 in 40) to be a silent 2+0 carrier for SMA. SMA is a condition caused by mutations in the SMN1 gene. Typically, individuals have two copies of the SMN1 gene, with one copy present on each chromosome. In SMA silent carriers, both copies of the SMN1 gene are present on one chromosome, with no copies of SMN1 present on the other chromosome.  SMA is characterized by progressive muscle weakness and atrophy due to degeneration and loss of anterior horn cells (lower motor  neurons) in the spinal cord and brain stem. We discussed the different types of SMA (0, I, II, and III), including differences in severity and age of onset. We also reviewed the autosomal recessive inheritance pattern of SMA. We discussed that the couple only has a chance of having a child with SMA if both parents are identified as carriers for the condition. Based on the carrier frequency for SMA in the African American population, Ms. Kristen Grimes's partner currently has a 1 in 40 chance of being a carrier of SMA. Without partner screening to refine risk and based on her partner's ethnicity, the couple currently has a 1 in 8976 (0.01%) risk of having a child with SMA. If Ms. Kristen Grimes's partner were found to have 2 copies of SMN1, his risk of being a carrier would be reduced but not eliminated. If both parents are carriers of SMA, there is a 1 in 4 (25%) chance of having an affected fetus. We discussed that carrier screening for SMA is recommended for Ms. Kristen Grimes's partner to refine their risk of having an affected child. Ms. Kristen Grimes indicated that her partner likely would not be interested in carrier screening. Ms. Kristen Grimes was also informed that SMA was recently added to [place]'s newborn screen.  Ms. Kristen Grimes carrier screening was negative for the other 13 conditions screened. Thus, her risk to be a carrier for these additional conditions (listed separately in the laboratory report) has been reduced but not eliminated. This also significantly reduces her risk of having a child affected by one of these conditions.   Aneuploidy screening results:  We also reviewed that Ms. Kristen Grimes had Panorama NIPS through the laboratory [facility] that was low-risk for fetal aneuploidies. We reviewed that these results showed a less than 1 in 10,000 risk for trisomies 21, 18 and 13, and monosomy X (Turner syndrome).  In addition, the risk for triploidy and sex chromosome trisomies (47,XXX and 47,XXY) was also low. Ms. Kristen Grimes elected to have  cfDNA analysis for 22q11.2 deletion syndrome, which was also low risk (1 in 9000). We reviewed that while this testing identifies 94-99% of pregnancies with trisomy 42, trisomy 64, trisomy 93, sex chromosome aneuploidies, and triploidy, it is NOT diagnostic. A positive test result requires confirmation by [facility] or amniocentesis, and a negative test result does not rule out a fetal chromosome abnormality. She also understands that this testing does not identify all genetic conditions.  Diagnostic testing:  Ms. Bettes was also counseled regarding diagnostic testing via amniocentesis. We discussed the technical aspects of the procedure and quoted up to a 1 in 500 (0.2%) risk for spontaneous pregnancy loss or other adverse pregnancy outcomes as a result of amniocentesis. Cultured cells from an amniocentesis sample allow for the visualization of a fetal karyotype, which can detect >99% of chromosomal aberrations. Chromosomal microarray can also be performed to identify smaller deletions or duplications of fetal chromosomal material. Amniocentesis could also be performed to assess whether the baby is affected by SMA. After careful consideration, Ms. Arshad declined amniocentesis at this time. She understands that amniocentesis is available at any point after 16 weeks of pregnancy and that she may opt to undergo the procedure at a later date should she change her mind.  Plan:  Additional screening and diagnostic testing were declined today. Ms. Ehle understands that screening tests, including ultrasound, cannot rule out all birth defects or genetic  syndromes. The patient was advised of this limitation and states she still does not want additional testing or screening at this time.   I counseled Ms. Gander regarding the above risks and available options. Second year [employer] genetic counseling student Norlene Duel participated in portions of today's session under my supervision. The approximate face-to-face time with the  genetic counselor was 30 minutes.  In summary:  Discussed carrier screening results and options for follow-up testing  Increased risk (1 in 34, ~3%) to be silent carrier for spinal muscular atrophy  Declined partner carrier screening  Reviewed low-risk NIPS result  Reduction in risk for Down syndrome, trisomy 77, trisomy 48, triploidy, sex chromosome aneuploidies, and 22q11.2 deletion syndrome  Offered additional testing and screening  Declined amniocentesis  Reviewed family history concerns   Gershon Crane, MS, Aeronautical engineer

## 2019-10-13 ENCOUNTER — Other Ambulatory Visit: Payer: Medicaid Other

## 2019-10-24 ENCOUNTER — Encounter: Payer: Medicaid Other | Admitting: Obstetrics

## 2019-10-24 ENCOUNTER — Other Ambulatory Visit: Payer: Medicaid Other

## 2019-10-25 ENCOUNTER — Other Ambulatory Visit: Payer: Medicaid Other

## 2019-10-25 ENCOUNTER — Encounter: Payer: Medicaid Other | Admitting: Obstetrics

## 2019-11-02 ENCOUNTER — Encounter: Payer: Medicaid Other | Admitting: Obstetrics

## 2019-11-02 ENCOUNTER — Other Ambulatory Visit: Payer: Medicaid Other

## 2019-11-07 ENCOUNTER — Encounter: Payer: Self-pay | Admitting: Obstetrics

## 2019-11-07 ENCOUNTER — Telehealth (INDEPENDENT_AMBULATORY_CARE_PROVIDER_SITE_OTHER): Payer: Medicaid Other | Admitting: Obstetrics

## 2019-11-07 VITALS — BP 110/53 | HR 95

## 2019-11-07 DIAGNOSIS — O99820 Streptococcus B carrier state complicating pregnancy: Secondary | ICD-10-CM

## 2019-11-07 DIAGNOSIS — O09899 Supervision of other high risk pregnancies, unspecified trimester: Secondary | ICD-10-CM

## 2019-11-07 DIAGNOSIS — Z3480 Encounter for supervision of other normal pregnancy, unspecified trimester: Secondary | ICD-10-CM

## 2019-11-07 DIAGNOSIS — Z3A31 31 weeks gestation of pregnancy: Secondary | ICD-10-CM

## 2019-11-07 NOTE — Progress Notes (Signed)
° °  OBSTETRICS PRENATAL VIRTUAL VISIT ENCOUNTER NOTE  Provider location: Center for [facility]   I connected with Kristen Grimes on 11/07/19 at  9:45 AM EDT by MyChart Video Encounter at home and verified that I am speaking with the correct person using two identifiers.   I discussed the limitations, risks, security and privacy concerns of performing an evaluation and management service virtually and the availability of in person appointments. I also discussed with the patient that there may be a patient responsible charge related to this service. The patient expressed understanding and agreed to proceed. Subjective:  Kristen Grimes is a 29 y.o. G4P3003 at [redacted]w[redacted]d being seen today for ongoing prenatal care.  She is currently monitored for the following issues for this low-risk pregnancy and has Seasonal rhinitis; Normal labor; Supervision of other normal pregnancy, antepartum; and Dugger interval between pregnancies affecting pregnancy, antepartum on their problem list.  Patient reports no complaints.  Contractions: Irritability. Vag. Bleeding: None.  Movement: Present. Denies any leaking of fluid.   The following portions of the patient's history were reviewed and updated as appropriate: allergies, current medications, past family history, past medical history, past social history, past surgical history and problem list.   Objective:   Vitals:   11/07/19 1013  BP: (!) 110/53  Pulse: 95    Fetal Status:     Movement: Present     General:  Alert, oriented and cooperative. Patient is in no acute distress.  Respiratory: Normal respiratory effort, no problems with respiration noted  Mental Status: Normal mood and affect. Normal behavior. Normal judgment and thought content.  Rest of physical exam deferred due to type of encounter  Imaging: No results found.  Assessment and Plan:  Pregnancy: G4P3003 at [redacted]w[redacted]d There are no diagnoses linked to this encounter. Preterm labor  symptoms and general obstetric precautions including but not limited to vaginal bleeding, contractions, leaking of fluid and fetal movement were reviewed in detail with the patient. I discussed the assessment and treatment plan with the patient. The patient was provided an opportunity to ask questions and all were answered. The patient agreed with the plan and demonstrated an understanding of the instructions. The patient was advised to call back or seek an in-person office evaluation/go to MAU at [facility] for any urgent or concerning symptoms. Please refer to After Visit Summary for other counseling recommendations.   I provided 10 minutes of face-to-face time during this encounter.  Return in about 2 weeks (around 11/21/2019) for MyChart.  Future Appointments  Date Time Provider Department Center  11/11/2019  8:45 AM [facility] None    Coral Ceo, MD Center for [facility], [facility] 11/07/19

## 2019-11-11 ENCOUNTER — Other Ambulatory Visit: Payer: Medicaid Other

## 2019-11-11 ENCOUNTER — Other Ambulatory Visit: Payer: Self-pay

## 2019-11-11 DIAGNOSIS — Z3480 Encounter for supervision of other normal pregnancy, unspecified trimester: Secondary | ICD-10-CM

## 2019-11-12 LAB — GLUCOSE TOLERANCE, 2 HOURS W/ 1HR
Glucose, 1 hour: 86 mg/dL (ref 65–179)
Glucose, 2 hour: 84 mg/dL (ref 65–152)
Glucose, Fasting: 76 mg/dL (ref 65–91)

## 2019-11-12 LAB — CBC
Hematocrit: 34.2 % (ref 34.0–46.6)
Hemoglobin: 11.4 g/dL (ref 11.1–15.9)
MCH: 30.8 pg (ref 26.6–33.0)
MCHC: 33.3 g/dL (ref 31.5–35.7)
MCV: 92 fL (ref 79–97)
Platelets: 248 10*3/uL (ref 150–450)
RBC: 3.7 x10E6/uL — ABNORMAL LOW (ref 3.77–5.28)
RDW: 12.8 % (ref 11.7–15.4)
WBC: 5.7 10*3/uL (ref 3.4–10.8)

## 2019-11-12 LAB — RPR: RPR Ser Ql: NONREACTIVE

## 2019-11-12 LAB — HIV ANTIBODY (ROUTINE TESTING W REFLEX): HIV Screen 4th Generation wRfx: NONREACTIVE

## 2019-11-14 ENCOUNTER — Other Ambulatory Visit: Payer: Medicaid Other

## 2019-11-22 ENCOUNTER — Encounter: Payer: Self-pay | Admitting: Obstetrics and Gynecology

## 2019-11-22 ENCOUNTER — Telehealth (INDEPENDENT_AMBULATORY_CARE_PROVIDER_SITE_OTHER): Payer: Medicaid Other | Admitting: Obstetrics and Gynecology

## 2019-11-22 DIAGNOSIS — O09893 Supervision of other high risk pregnancies, third trimester: Secondary | ICD-10-CM

## 2019-11-22 DIAGNOSIS — Z3480 Encounter for supervision of other normal pregnancy, unspecified trimester: Secondary | ICD-10-CM

## 2019-11-22 DIAGNOSIS — O09899 Supervision of other high risk pregnancies, unspecified trimester: Secondary | ICD-10-CM

## 2019-11-22 DIAGNOSIS — Z3A33 33 weeks gestation of pregnancy: Secondary | ICD-10-CM

## 2019-11-22 NOTE — Progress Notes (Signed)
I connected with Margorie John on 11/22/19 at  2:00 PM EDT by telephone and verified that I am speaking with the correct person using two identifiers.  Pt unable to check BP she's not at home. No complaints today per pt

## 2019-11-22 NOTE — Patient Instructions (Signed)

## 2019-11-22 NOTE — Progress Notes (Signed)
   MY CHART VIDEO VIRTUAL OBSTETRICS VISIT ENCOUNTER NOTE  I connected with Alliah Henricks on 11/22/19 at  2:00 PM EDT by My Chart video in her parked car after picking up children from school and verified that I am speaking with the correct person using two identifiers. Provider located at [employer] for [employer] at [place].   I discussed the limitations, risks, security and privacy concerns of performing an evaluation and management service by My Chart video and the availability of in person appointments. I also discussed with the patient that there may be a patient responsible charge related to this service. The patient expressed understanding and agreed to proceed.  Subjective:  Kristen Grimes is a 29 y.o. G4P3003 at [redacted]w[redacted]d being followed for ongoing prenatal care.  She is currently monitored for the following issues for this low-risk pregnancy and has Seasonal rhinitis; Normal labor; Supervision of other normal pregnancy, antepartum; and Ney interval between pregnancies affecting pregnancy, antepartum on their problem list.  Patient reports no complaints. Reports fetal movement. Denies any contractions, bleeding or leaking of fluid.   The following portions of the patient's history were reviewed and updated as appropriate: allergies, current medications, past family history, past medical history, past social history, past surgical history and problem list.   Objective:   General:  Alert, oriented and cooperative.   Mental Status: Normal mood and affect perceived. Normal judgment and thought content.  Rest of physical exam deferred due to type of encounter  LMP 04/27/2019 (LMP Unknown)  **Patient not at home - unable to check BP today  Assessment and Plan:  Pregnancy: G4P3003 at [redacted]w[redacted]d  1. Supervision of other normal pregnancy, antepartum - Discussed GBS testing at 36 wks visit  Preterm labor symptoms and general obstetric precautions including but not limited to vaginal  bleeding, contractions, leaking of fluid and fetal movement were reviewed in detail with the patient.  I discussed the assessment and treatment plan with the patient. The patient was provided an opportunity to ask questions and all were answered. The patient agreed with the plan and demonstrated an understanding of the instructions. The patient was advised to call back or seek an in-person office evaluation/go to MAU at [facility] for any urgent or concerning symptoms. Please refer to After Visit Summary for other counseling recommendations.   I provided 5 minutes of non-face-to-face time during this encounter. There was 5 minutes of chart review time spent prior to this encounter. Total time spent = 10 minutes.  Return in about 3 weeks (around 12/13/2019) for Return OB w/GBS.  No future appointments.  Raelyn Mora, CNM Center for [employer], [facility]

## 2019-12-07 ENCOUNTER — Other Ambulatory Visit: Payer: Medicaid Other

## 2019-12-07 DIAGNOSIS — Z20822 Contact with and (suspected) exposure to covid-19: Secondary | ICD-10-CM

## 2019-12-09 LAB — NOVEL CORONAVIRUS, NAA: SARS-CoV-2, NAA: NOT DETECTED

## 2019-12-09 LAB — SARS-COV-2, NAA 2 DAY TAT

## 2019-12-13 ENCOUNTER — Other Ambulatory Visit (HOSPITAL_COMMUNITY)
Admission: RE | Admit: 2019-12-13 | Discharge: 2019-12-13 | Disposition: A | Discharge status: Home / Self Care | Payer: Medicaid Other | Source: Ambulatory Visit | Attending: Advanced Practice Midwife | Admitting: Advanced Practice Midwife

## 2019-12-13 ENCOUNTER — Other Ambulatory Visit: Payer: Self-pay

## 2019-12-13 ENCOUNTER — Ambulatory Visit (INDEPENDENT_AMBULATORY_CARE_PROVIDER_SITE_OTHER): Payer: Medicaid Other | Admitting: Advanced Practice Midwife

## 2019-12-13 VITALS — BP 110/68 | HR 89 | Wt 190.0 lb

## 2019-12-13 DIAGNOSIS — L240 Irritant contact dermatitis due to detergents: Secondary | ICD-10-CM

## 2019-12-13 DIAGNOSIS — Z3480 Encounter for supervision of other normal pregnancy, unspecified trimester: Secondary | ICD-10-CM | POA: Insufficient documentation

## 2019-12-13 DIAGNOSIS — O09899 Supervision of other high risk pregnancies, unspecified trimester: Secondary | ICD-10-CM

## 2019-12-13 MED ORDER — HYDROCORTISONE 0.5 % EX CREA: 1.0000 "application " | TOPICAL_CREAM | Freq: Two times a day (BID) | CUTANEOUS | 0 refills | Status: DC

## 2019-12-13 NOTE — Patient Instructions (Signed)
Things to Try After 37 weeks to Encourage Labor/Get Ready for Labor:   1.  Try the Miles Circuit at www.milescircuit.com daily to improve baby's position and encourage the onset of labor.  2. Walk a little and rest a little every day.  Change positions often.  3. Cervical Ripening: May try one or both a. Red Raspberry Leaf capsules or tea:  two 300mg or 400mg tablets with each meal, 2-3 times a day, or 1-3 cups of tea daily  Potential Side Effects Of Raspberry Leaf:  Most women do not experience any side effects from drinking raspberry leaf tea. However, nausea and loose stools are possible   b. Evening Primrose Oil capsules: may take 1 to 3 capsules daily. Take 1-2 capsules by mouth each day and place one capsule vaginally at night.  You may also prick the vaginal capsule to release the oil prior to inserting in the vagina. Some of the potential side effects:  Upset stomach  Loose stools or diarrhea  Headaches  Nausea  4. Sex (and especially sex with orgasm) can also help the cervix ripen and encourage labor onset.  Labor Precautions Reasons to come to MAU at [facility]:  1.  Contractions are  5 minutes apart or less, each last 1 minute, these have been going on for 1-2 hours, and you cannot walk or talk during them 2.  You have a large gush of fluid, or a trickle of fluid that will not stop and you have to wear a pad 3.  You have bleeding that is bright red, heavier than spotting--like menstrual bleeding (spotting can be normal in early labor or after a check of your cervix) 4.  You do not feel the baby moving like he/she normally does 

## 2019-12-13 NOTE — Progress Notes (Signed)
   PRENATAL VISIT NOTE  Subjective:  Kristen Grimes is a 29 y.o. G4P3003 at [redacted]w[redacted]d being seen today for ongoing prenatal care.  She is currently monitored for the following issues for this low-risk pregnancy and has Seasonal rhinitis; Supervision of other normal pregnancy, antepartum; and Deol interval between pregnancies affecting pregnancy, antepartum on their problem list.  Patient reports occasional contractions.  Contractions: Not present. Vag. Bleeding: None.  Movement: Present. Denies leaking of fluid.   The following portions of the patient's history were reviewed and updated as appropriate: allergies, current medications, past family history, past medical history, past social history, past surgical history and problem list.   Objective:   Vitals:   12/13/19 0859  BP: 110/68  Pulse: 89  Weight: 190 lb (86.2 kg)    Fetal Status: Fetal Heart Rate (bpm): 140   Movement: Present     General:  Alert, oriented and cooperative. Patient is in no acute distress.  Skin: Skin is warm and dry. No rash noted.   Cardiovascular: Normal heart rate noted  Respiratory: Normal respiratory effort, no problems with respiration noted  Abdomen: Soft, gravid, appropriate for gestational age.  Pain/Pressure: Absent     Pelvic: Cervical exam performed in the presence of a chaperone        Extremities: Normal range of motion.  Edema: None  Mental Status: Normal mood and affect. Normal behavior. Normal judgment and thought content.   Assessment and Plan:  Pregnancy: G4P3003 at [redacted]w[redacted]d 1. Adduci interval between pregnancies affecting pregnancy, antepartum   2. Supervision of other normal pregnancy, antepartum --Anticipatory guidance about next visits/weeks of pregnancy given. --next visit in 2 weeks in office  - Strep Gp B NAA - Cervicovaginal ancillary only( [place])  3. Irritant contact dermatitis due to detergent --Rash at right wrist, likely from lotion or hand soap - hydrocortisone cream  0.5 %; Apply 1 application topically 2 (two) times daily.  Dispense: 30 g; Refill: 0  Term labor symptoms and general obstetric precautions including but not limited to vaginal bleeding, contractions, leaking of fluid and fetal movement were reviewed in detail with the patient. Please refer to After Visit Summary for other counseling recommendations.   Return in about 2 weeks (around 12/27/2019).  Future Appointments  Date Time Provider Department Center  12/27/2019  9:35 AM Nugent, Odie Sera, NP [facility] None    [name], CNM

## 2019-12-13 NOTE — Progress Notes (Signed)
ROB [redacted]w[redacted]d  CC: excessive dry skin.  Pt would like her cervix checked today as well.

## 2019-12-14 LAB — CERVICOVAGINAL ANCILLARY ONLY
Chlamydia: NEGATIVE
Comment: NEGATIVE
Comment: NEGATIVE
Comment: NORMAL
Neisseria Gonorrhea: NEGATIVE
Trichomonas: NEGATIVE

## 2019-12-15 LAB — STREP GP B NAA: Strep Gp B NAA: NEGATIVE

## 2019-12-16 ENCOUNTER — Encounter: Payer: Self-pay | Admitting: Advanced Practice Midwife

## 2019-12-27 ENCOUNTER — Other Ambulatory Visit: Payer: Self-pay

## 2019-12-27 ENCOUNTER — Ambulatory Visit (INDEPENDENT_AMBULATORY_CARE_PROVIDER_SITE_OTHER): Payer: Medicaid Other | Admitting: Women's Health

## 2019-12-27 ENCOUNTER — Encounter: Payer: Self-pay | Admitting: Women's Health

## 2019-12-27 VITALS — BP 114/70 | HR 74 | Temp 98.4°F | Wt 191.4 lb

## 2019-12-27 DIAGNOSIS — O99820 Streptococcus B carrier state complicating pregnancy: Secondary | ICD-10-CM

## 2019-12-27 DIAGNOSIS — Z3480 Encounter for supervision of other normal pregnancy, unspecified trimester: Secondary | ICD-10-CM

## 2019-12-27 DIAGNOSIS — Z3A38 38 weeks gestation of pregnancy: Secondary | ICD-10-CM

## 2019-12-27 NOTE — Patient Instructions (Addendum)
Maternity Assessment Unit (MAU)  The Maternity Assessment Unit (MAU) is located at the [facility] at [facility]. The address is: [address], [place], [place], [place] [address]. Please see map below for additional directions.    The Maternity Assessment Unit is designed to help you during your pregnancy, and for up to 6 weeks after delivery, with any pregnancy- or postpartum-related emergencies, if you think you are in labor, or if your water has broken. For example, if you experience nausea and vomiting, vaginal bleeding, severe abdominal or pelvic pain, elevated blood pressure or other problems related to your pregnancy or postpartum time, please come to the Maternity Assessment Unit for assistance.        Group B Streptococcus Infection During Pregnancy Group B Streptococcus (GBS) is a type of bacteria that is often found in healthy people. It is commonly found in the rectum, vagina, and intestines. In people who are healthy and not pregnant, the bacteria rarely cause serious illness or complications. However, women who test positive for GBS during pregnancy can pass the bacteria to the baby during childbirth. This can cause serious infection in the baby after birth. Women with GBS may also have infections during their pregnancy or soon after childbirth. The infections include urinary tract infections (UTIs) or infections of the uterus. GBS also increases a woman's risk of complications during pregnancy, such as early labor or delivery, miscarriage, or stillbirth. Routine testing for GBS is recommended for all pregnant women. What are the causes? This condition is caused by bacteria called Streptococcus agalactiae. What increases the risk? You may have a higher risk for GBS infection during pregnancy if you had one during a past pregnancy. What are the signs or symptoms? In most cases, GBS infection does not cause symptoms in pregnant women. If symptoms  exist, they may include:  Labor that starts before the 37th week of pregnancy.  A UTI or bladder infection. This may cause a fever, frequent urination, or pain and burning during urination.  Fever during labor. There can also be a rapid heartbeat in the mother or baby. Rare but serious symptoms of a GBS infection in women include:  Blood infection (septicemia). This may cause fever, chills, or confusion.  Lung infection (pneumonia). This may cause fever, chills, cough, rapid breathing, chest pain, or difficulty breathing.  Bone, joint, skin, or soft tissue infection. How is this diagnosed? You may be screened for GBS between week 35 and week 37 of pregnancy. If you have symptoms of preterm labor, you may be screened earlier. This condition is diagnosed based on lab test results from:  A swab of fluid from the vagina and rectum.  A urine sample. How is this treated? This condition is treated with antibiotic medicine. Antibiotic medicine may be given:  To you when you go into labor, or as soon as your water breaks. The medicines will continue until after you give birth. If you are having a cesarean delivery, you do not need antibiotics unless your water has broken.  To your baby, if he or she requires treatment. Your health care provider will check your baby to decide if he or she needs antibiotics to prevent a serious infection. Follow these instructions at home:  Take over-the-counter and prescription medicines only as told by your health care provider.  Take your antibiotic medicine as told by your health care provider. Do not stop taking the antibiotic even if you start to feel better.  Keep all  pre-birth (prenatal) visits and follow-up visits as told by your health care provider. This is important. Contact a health care provider if:  You have pain or burning when you urinate.  You have to urinate more often than usual.  You have a fever or chills.  You develop a  bad-smelling vaginal discharge. Get help right away if:  Your water breaks.  You go into labor.  You have severe pain in your abdomen.  You have difficulty breathing.  You have chest pain. These symptoms may represent a serious problem that is an emergency. Do not wait to see if the symptoms will go away. Get medical help right away. Call your local emergency services (911 in the [place] Do not drive yourself to the hospital. Summary  GBS is a type of bacteria that is common in healthy people.  During pregnancy, colonization with GBS can cause serious complications for you or your baby.  Your health care provider will screen you between 35 and 37 weeks of pregnancy to determine if you are colonized with GBS.  If you are colonized with GBS during pregnancy, your health care provider will recommend antibiotics through an IV during labor.  After delivery, your baby will be evaluated for complications related to potential GBS infection and may require antibiotics to prevent a serious infection. This information is not intended to replace advice given to you by your health care provider. Make sure you discuss any questions you have with your health care provider. Document Revised: 09/27/2018 Document Reviewed: 09/27/2018 Elsevier Patient Education  2020 [employer].        Signs and Symptoms of Labor Labor is your body's natural process of moving your baby, placenta, and umbilical cord out of your uterus. The process of labor usually starts when your baby is full-term, between 21 and 40 weeks of pregnancy. How will I know when I am close to going into labor? As your body prepares for labor and the birth of your baby, you may notice the following symptoms in the weeks and days before true labor starts:  Having a strong desire to get your home ready to receive your new baby. This is called nesting. Nesting may be a sign that labor is approaching, and it may occur several weeks before  birth. Nesting may involve cleaning and organizing your home.  Passing a small amount of thick, bloody mucus out of your vagina (normal bloody show or losing your mucus plug). This may happen more than a week before labor begins, or it might occur right before labor begins as the opening of the cervix starts to widen (dilate). For some women, the entire mucus plug passes at once. For others, smaller portions of the mucus plug may gradually pass over several days.  Your baby moving (dropping) lower in your pelvis to get into position for birth (lightening). When this happens, you may feel more pressure on your bladder and pelvic bone and less pressure on your ribs. This may make it easier to breathe. It may also cause you to need to urinate more often and have problems with bowel movements.  Having "practice contractions" (Braxton Hicks contractions) that occur at irregular (unevenly spaced) intervals that are more than 10 minutes apart. This is also called false labor. False labor contractions are common after exercise or sexual activity, and they will stop if you change position, rest, or drink fluids. These contractions are usually mild and do not get stronger over time. They may feel like: ?  A backache or back pain. ? Mild cramps, similar to menstrual cramps. ? Tightening or pressure in your abdomen. Other early symptoms that labor may be starting soon include:  Nausea or loss of appetite.  Diarrhea.  Having a sudden burst of energy, or feeling very tired.  Mood changes.  Having trouble sleeping. How will I know when labor has begun? Signs that true labor has begun may include:  Having contractions that come at regular (evenly spaced) intervals and increase in intensity. This may feel like more intense tightening or pressure in your abdomen that moves to your back. ? Contractions may also feel like rhythmic pain in your upper thighs or back that comes and goes at regular intervals. ? For  first-time mothers, this change in intensity of contractions often occurs at a more gradual pace. ? Women who have given birth before may notice a more rapid progression of contraction changes.  Having a feeling of pressure in the vaginal area.  Your water breaking (rupture of membranes). This is when the sac of fluid that surrounds your baby breaks. When this happens, you will notice fluid leaking from your vagina. This may be clear or blood-tinged. Labor usually starts within 24 hours of your water breaking, but it may take longer to begin. ? Some women notice this as a gush of fluid. ? Others notice that their underwear repeatedly becomes damp. Follow these instructions at home:   When labor starts, or if your water breaks, call your health care provider or nurse care line. Based on your situation, they will determine when you should go in for an exam.  When you are in early labor, you may be able to rest and manage symptoms at home. Some strategies to try at home include: ? Breathing and relaxation techniques. ? Taking a warm bath or shower. ? Listening to music. ? Using a heating pad on the lower back for pain. If you are directed to use heat:  Place a towel between your skin and the heat source.  Leave the heat on for 20-30 minutes.  Remove the heat if your skin turns bright red. This is especially important if you are unable to feel pain, heat, or cold. You may have a greater risk of getting burned. Get help right away if:  You have painful, regular contractions that are 5 minutes apart or less.  Labor starts before you are [redacted] weeks along in your pregnancy.  You have a fever.  You have a headache that does not go away.  You have bright red blood coming from your vagina.  You do not feel your baby moving.  You have a sudden onset of: ? Severe headache with vision problems. ? Nausea, vomiting, or diarrhea. ? Chest pain or shortness of breath. These symptoms may be an  emergency. If your health care provider recommends that you go to the hospital or birth center where you plan to deliver, do not drive yourself. Have someone else drive you, or call emergency services (911 in the [place] Summary  Labor is your body's natural process of moving your baby, placenta, and umbilical cord out of your uterus.  The process of labor usually starts when your baby is full-term, between 78 and 40 weeks of pregnancy.  When labor starts, or if your water breaks, call your health care provider or nurse care line. Based on your situation, they will determine when you should go in for an exam. This information is not intended to replace  advice given to you by your health care provider. Make sure you discuss any questions you have with your health care provider. Document Revised: 12/01/2016 Document Reviewed: 08/08/2016 Elsevier Patient Education  2020 [employer].       New Induction of Labor Process for [facility]  In Fall 2020 [facility] changed it's process for scheduling inductions of labor to create more induction slots and to make sure patients get COVID-19 testing in advance. After you have been tested you need to quarantine so that you do not get infected after your test. You should not go anywhere after your test except necessary medical appointments.  You have been scheduled for induction of labor on 01/15/2020. Although you may have a specific time listed on your After Visit Summary or MyChart, we cannot predict when your room will be available. Please disregard this time. A Labor and Delivery staff member will call you on the day that you are scheduled when your room is available. You will need to arrive within one hour of being called. If you do not arrive within this time frame, the next person on the list will be called in and you will move down the list. You may eat a light meal before coming to the  hospital. If you go into labor, think your water has broken, experience bright red bleeding or don't feel your baby moving as much as usual before your induction, please call your Ob/Gyn's office or come to Entrance C, Maternity Assessment Unit for evaluation.  Thank you,  Center for [facility]                         Safe Medications in Pregnancy    Acne: Benzoyl Peroxide Salicylic Acid  Backache/Headache: Tylenol: 2 regular strength every 4 hours OR              2 Extra strength every 6 hours  Colds/Coughs/Allergies: Benadryl (alcohol free) 25 mg every 6 hours as needed Breath right strips Claritin Cepacol throat lozenges Chloraseptic throat spray Cold-Eeze- up to three times per day Cough drops, alcohol free Flonase (by prescription only) Guaifenesin Mucinex Robitussin DM (plain only, alcohol free) Saline nasal spray/drops Sudafed (pseudoephedrine) & Actifed ** use only after [redacted] weeks gestation and if you do not have high blood pressure Tylenol Vicks Vaporub Zinc lozenges Zyrtec   Constipation: Colace Ducolax suppositories Fleet enema Glycerin suppositories Metamucil Milk of magnesia Miralax Senokot Smooth move tea  Diarrhea: Kaopectate Imodium A-D  *NO pepto Bismol  Hemorrhoids: Anusol Anusol HC Preparation H Tucks  Indigestion: Tums Maalox Mylanta Zantac  Pepcid  Insomnia: Benadryl (alcohol free) 25mg  every 6 hours as needed Tylenol PM Unisom, no Gelcaps  Leg Cramps: Tums MagGel  Nausea/Vomiting:  Bonine Dramamine Emetrol Ginger extract Sea bands Meclizine  Nausea medication to take during pregnancy:  Unisom (doxylamine succinate 25 mg tablets) Take one tablet daily at bedtime. If symptoms are not adequately controlled, the dose can be increased to a maximum recommended dose of two tablets daily (1/2 tablet in the morning, 1/2 tablet mid-afternoon and one at bedtime). Vitamin B6 100mg  tablets. Take one tablet  twice a day (up to 200 mg per day).  Skin Rashes: Aveeno products Benadryl cream or 25mg  every 6 hours as needed Calamine Lotion 1% cortisone cream  Yeast infection: Gyne-lotrimin 7 Monistat 7   **If taking multiple medications, please check labels to avoid duplicating the same  active ingredients **take medication as directed on the label ** Do not exceed 4000 mg of tylenol in 24 hours **Do not take medications that contain aspirin or ibuprofen

## 2019-12-27 NOTE — Progress Notes (Signed)
Pt presents for ROB c/o coughing up clear mucous x couple of days.  Pt requests to start maternity leave today. Pt also reqs cx check today.

## 2019-12-27 NOTE — Progress Notes (Signed)
Subjective:  Kristen Grimes is a 29 y.o. G4P3003 at [redacted]w[redacted]d being seen today for ongoing prenatal care.  She is currently monitored for the following issues for this low-risk pregnancy and has Seasonal rhinitis; GBS (group B Streptococcus carrier), +RV culture, currently pregnant; Supervision of other normal pregnancy, antepartum; and Lisowski interval between pregnancies affecting pregnancy, antepartum on their problem list.  Patient reports no complaints.  Contractions: Irritability. Vag. Bleeding: None.  Movement: Present. Denies leaking of fluid.   The following portions of the patient's history were reviewed and updated as appropriate: allergies, current medications, past family history, past medical history, past social history, past surgical history and problem list. Problem list updated.  Objective:   Vitals:   12/27/19 0948  BP: 114/70  Pulse: 74  Temp: 98.4 F (36.9 C)  Weight: 191 lb 6.4 oz (86.8 kg)    Fetal Status: Fetal Heart Rate (bpm): 155   Movement: Present     General:  Alert, oriented and cooperative. Patient is in no acute distress.  Skin: Skin is warm and dry. No rash noted.   Cardiovascular: Normal heart rate noted  Respiratory: Normal respiratory effort, no problems with respiration noted  Abdomen: Soft, gravid, appropriate for gestational age. Pain/Pressure: Present     Pelvic: Vag. Bleeding: None     Cervical exam deferred        Extremities: Normal range of motion.  Edema: None  Mental Status: Normal mood and affect. Normal behavior. Normal judgment and thought content.   Urinalysis:      Assessment and Plan:  Pregnancy: G4P3003 at [redacted]w[redacted]d  1. Supervision of other normal pregnancy, antepartum -IOL scheduled today at 41 weeks for 01/15/2020, IOL orders entered, process discussed with patient -note given to patient for work that confirms pregnancy and care as patient desires to start maternity leave at this time. Discussed FMLA, disability and working with  employer -patient reporting allergy symptoms with clear nasal drainage and coughing up clear mucus. Has tried mucinex abnd Robitussin without success. Safe meds in pregnancy list given with focus on allergy medications. Advised COVID swab if unrelieved by allergy meds, patient states she had COVID swab a few days ago which was negative.  2. GBS (group B Streptococcus carrier), +RV culture, currently pregnant -treat in labor  Term labor symptoms and general obstetric precautions including but not limited to vaginal bleeding, contractions, leaking of fluid and fetal movement were reviewed in detail with the patient. I discussed the assessment and treatment plan with the patient. The patient was provided an opportunity to ask questions and all were answered. The patient agreed with the plan and demonstrated an understanding of the instructions. The patient was advised to call back or seek an in-person office evaluation/go to MAU at [facility] for any urgent or concerning symptoms. Please refer to After Visit Summary for other counseling recommendations.  Return in about 1 week (around 01/03/2020) for in-person LOB/APP OK.   Arias Weinert, Odie Sera, NP

## 2020-01-03 ENCOUNTER — Encounter: Payer: Medicaid Other | Admitting: Obstetrics and Gynecology

## 2020-01-09 ENCOUNTER — Telehealth (HOSPITAL_COMMUNITY): Payer: Self-pay | Admitting: *Deleted

## 2020-01-09 NOTE — Telephone Encounter (Signed)
Preadmission screen  

## 2020-01-10 ENCOUNTER — Telehealth (HOSPITAL_COMMUNITY): Payer: Self-pay | Admitting: *Deleted

## 2020-01-10 NOTE — Telephone Encounter (Signed)
Preadmission screen  

## 2020-01-11 ENCOUNTER — Telehealth (HOSPITAL_COMMUNITY): Payer: Self-pay | Admitting: *Deleted

## 2020-01-11 ENCOUNTER — Encounter (HOSPITAL_COMMUNITY): Payer: Self-pay | Admitting: *Deleted

## 2020-01-11 ENCOUNTER — Other Ambulatory Visit: Payer: Self-pay | Admitting: Advanced Practice Midwife

## 2020-01-11 NOTE — Telephone Encounter (Signed)
Updated infection status

## 2020-01-11 NOTE — Telephone Encounter (Signed)
Preadmission screen  

## 2020-01-12 ENCOUNTER — Ambulatory Visit (INDEPENDENT_AMBULATORY_CARE_PROVIDER_SITE_OTHER): Payer: Medicaid Other | Admitting: Obstetrics and Gynecology

## 2020-01-12 ENCOUNTER — Other Ambulatory Visit: Payer: Self-pay

## 2020-01-12 ENCOUNTER — Encounter: Payer: Self-pay | Admitting: Obstetrics and Gynecology

## 2020-01-12 VITALS — BP 115/71 | HR 64 | Wt 192.9 lb

## 2020-01-12 DIAGNOSIS — Z3A40 40 weeks gestation of pregnancy: Secondary | ICD-10-CM

## 2020-01-12 DIAGNOSIS — O480 Post-term pregnancy: Secondary | ICD-10-CM

## 2020-01-12 DIAGNOSIS — Z3480 Encounter for supervision of other normal pregnancy, unspecified trimester: Secondary | ICD-10-CM

## 2020-01-12 NOTE — Patient Instructions (Signed)

## 2020-01-12 NOTE — Progress Notes (Signed)
   PRENATAL VISIT NOTE  Subjective:  Kristen Grimes is a 29 y.o. G4P3003 at [redacted]w[redacted]d being seen today for ongoing prenatal care.  She is currently monitored for the following issues for this low-risk pregnancy and has Seasonal rhinitis; GBS (group B Streptococcus carrier), +RV culture, currently pregnant; Supervision of other normal pregnancy, antepartum; Riester interval between pregnancies affecting pregnancy, antepartum; and [redacted] weeks gestation of pregnancy on their problem list.  Patient doing well with no acute concerns today. She reports no complaints.  Contractions: Irritability. Vag. Bleeding: None.  Movement: Present. Denies leaking of fluid.   The following portions of the patient's history were reviewed and updated as appropriate: allergies, current medications, past family history, past medical history, past social history, past surgical history and problem list. Problem list updated.  Objective:   Vitals:   01/12/20 0933  BP: 115/71  Pulse: 64  Weight: 192 lb 14.4 oz (87.5 kg)    Fetal Status: Fetal Heart Rate (bpm): NST    Movement: Present     General:  Alert, oriented and cooperative. Patient is in no acute distress.  Skin: Skin is warm and dry. No rash noted.   Cardiovascular: Normal heart rate noted  Respiratory: Normal respiratory effort, no problems with respiration noted  Abdomen: Soft, gravid, appropriate for gestational age.  Pain/Pressure: Present     Pelvic: Cervical exam deferred        Extremities: Normal range of motion.     Mental Status:  Normal mood and affect. Normal behavior. Normal judgment and thought content.  NST baseline : 135  , moderate variability, no decels, sporadic contractions, duration 25 minutes Assessment and Plan:  Pregnancy: G4P3003 at [redacted]w[redacted]d  1. Supervision of other normal pregnancy, antepartum NST reactive, pt educated on fetal kick counts, she has IOL on 10/31 - Fetal nonstress test  2. [redacted] weeks gestation of pregnancy  - Fetal  nonstress test  Term labor symptoms and general obstetric precautions including but not limited to vaginal bleeding, contractions, leaking of fluid and fetal movement were reviewed in detail with the patient.  Please refer to After Visit Summary for other counseling recommendations.   No follow-ups on file.   Mariel Aloe, MD

## 2020-01-12 NOTE — Progress Notes (Signed)
Pt presents for ROB and post dates NST  IOL scheduled 01/15/20  Declines cx check today

## 2020-01-13 ENCOUNTER — Inpatient Hospital Stay (HOSPITAL_COMMUNITY): Payer: Medicaid Other | Admitting: Anesthesiology

## 2020-01-13 ENCOUNTER — Other Ambulatory Visit: Payer: Self-pay

## 2020-01-13 ENCOUNTER — Inpatient Hospital Stay (HOSPITAL_COMMUNITY)
Admission: AD | Admit: 2020-01-13 | Discharge: 2020-01-15 | DRG: 807 | Disposition: A | Discharge status: Home / Self Care | Payer: Medicaid Other | Attending: Obstetrics and Gynecology | Admitting: Obstetrics and Gynecology

## 2020-01-13 ENCOUNTER — Encounter (HOSPITAL_COMMUNITY): Payer: Self-pay | Admitting: Obstetrics and Gynecology

## 2020-01-13 ENCOUNTER — Other Ambulatory Visit (HOSPITAL_COMMUNITY)
Admission: RE | Admit: 2020-01-13 | Discharge: 2020-01-13 | Disposition: A | Discharge status: Home / Self Care | Payer: Medicaid Other | Source: Ambulatory Visit | Attending: Family Medicine | Admitting: Family Medicine

## 2020-01-13 DIAGNOSIS — O480 Post-term pregnancy: Principal | ICD-10-CM | POA: Diagnosis present

## 2020-01-13 DIAGNOSIS — Z3A40 40 weeks gestation of pregnancy: Secondary | ICD-10-CM | POA: Diagnosis not present

## 2020-01-13 DIAGNOSIS — O26893 Other specified pregnancy related conditions, third trimester: Secondary | ICD-10-CM | POA: Diagnosis present

## 2020-01-13 DIAGNOSIS — Z01812 Encounter for preprocedural laboratory examination: Secondary | ICD-10-CM | POA: Insufficient documentation

## 2020-01-13 DIAGNOSIS — O09899 Supervision of other high risk pregnancies, unspecified trimester: Secondary | ICD-10-CM

## 2020-01-13 DIAGNOSIS — Z20822 Contact with and (suspected) exposure to covid-19: Secondary | ICD-10-CM | POA: Diagnosis present

## 2020-01-13 DIAGNOSIS — Z3480 Encounter for supervision of other normal pregnancy, unspecified trimester: Secondary | ICD-10-CM

## 2020-01-13 LAB — CBC
HCT: 37.4 % (ref 36.0–46.0)
Hemoglobin: 12.6 g/dL (ref 12.0–15.0)
MCH: 30.9 pg (ref 26.0–34.0)
MCHC: 33.7 g/dL (ref 30.0–36.0)
MCV: 91.7 fL (ref 80.0–100.0)
Platelets: 244 10*3/uL (ref 150–400)
RBC: 4.08 MIL/uL (ref 3.87–5.11)
RDW: 14.3 % (ref 11.5–15.5)
WBC: 6.5 10*3/uL (ref 4.0–10.5)
nRBC: 0 % (ref 0.0–0.2)

## 2020-01-13 LAB — TYPE AND SCREEN
ABO/RH(D): B POS
Antibody Screen: NEGATIVE

## 2020-01-13 LAB — SARS CORONAVIRUS 2 (TAT 6-24 HRS): SARS Coronavirus 2: NEGATIVE

## 2020-01-13 MED ORDER — SOD CITRATE-CITRIC ACID 500-334 MG/5ML PO SOLN: 30.0000 mL | ORAL | Status: DC | PRN

## 2020-01-13 MED ORDER — EPHEDRINE 5 MG/ML INJ: 10.0000 mg | INTRAVENOUS | Status: DC | PRN

## 2020-01-13 MED ORDER — LACTATED RINGERS IV SOLN
INTRAVENOUS | Status: DC
  Administered 2020-01-13: 1000 mL via INTRAVENOUS

## 2020-01-13 MED ORDER — OXYCODONE-ACETAMINOPHEN 5-325 MG PO TABS: 1.0000 | ORAL_TABLET | ORAL | Status: DC | PRN

## 2020-01-13 MED ORDER — SODIUM CHLORIDE (PF) 0.9 % IJ SOLN
INTRAMUSCULAR | Status: DC | PRN
Start: 2020-01-13 — End: 2020-01-13
  Administered 2020-01-13: 12 mL/h via EPIDURAL

## 2020-01-13 MED ORDER — OXYTOCIN BOLUS FROM INFUSION
333.0000 mL | Freq: Once | INTRAVENOUS | Status: AC
  Administered 2020-01-13: 333 mL via INTRAVENOUS

## 2020-01-13 MED ORDER — LACTATED RINGERS IV SOLN: 500.0000 mL | Freq: Once | INTRAVENOUS | Status: DC

## 2020-01-13 MED ORDER — OXYTOCIN-SODIUM CHLORIDE 30-0.9 UT/500ML-% IV SOLN: 333.0000 m[IU]/h | Freq: Once | INTRAVENOUS | Status: DC

## 2020-01-13 MED ORDER — FENTANYL-BUPIVACAINE-NACL 0.5-0.125-0.9 MG/250ML-% EP SOLN
12.0000 mL/h | EPIDURAL | Status: DC | PRN
  Filled 2020-01-13: qty 250

## 2020-01-13 MED ORDER — ONDANSETRON HCL 4 MG/2ML IJ SOLN: 4.0000 mg | Freq: Four times a day (QID) | INTRAMUSCULAR | Status: DC | PRN

## 2020-01-13 MED ORDER — LIDOCAINE HCL (PF) 1 % IJ SOLN
INTRAMUSCULAR | Status: DC | PRN
  Administered 2020-01-13: 6 mL via EPIDURAL

## 2020-01-13 MED ORDER — DIPHENHYDRAMINE HCL 50 MG/ML IJ SOLN: 12.5000 mg | INTRAMUSCULAR | Status: DC | PRN

## 2020-01-13 MED ORDER — MISOPROSTOL 25 MCG QUARTER TABLET: 25.0000 ug | ORAL_TABLET | ORAL | Status: DC | PRN

## 2020-01-13 MED ORDER — OXYTOCIN-SODIUM CHLORIDE 30-0.9 UT/500ML-% IV SOLN
2.5000 [IU]/h | INTRAVENOUS | Status: DC
  Administered 2020-01-13: 2.5 [IU]/h via INTRAVENOUS
  Filled 2020-01-13: qty 500

## 2020-01-13 MED ORDER — FENTANYL-BUPIVACAINE-NACL 0.5-0.125-0.9 MG/250ML-% EP SOLN: 12.0000 mL/h | EPIDURAL | Status: DC | PRN

## 2020-01-13 MED ORDER — PHENYLEPHRINE 40 MCG/ML (10ML) SYRINGE FOR IV PUSH (FOR BLOOD PRESSURE SUPPORT): 80.0000 ug | PREFILLED_SYRINGE | INTRAVENOUS | Status: DC | PRN

## 2020-01-13 MED ORDER — OXYCODONE-ACETAMINOPHEN 5-325 MG PO TABS: 2.0000 | ORAL_TABLET | ORAL | Status: DC | PRN

## 2020-01-13 MED ORDER — TERBUTALINE SULFATE 1 MG/ML IJ SOLN: 0.2500 mg | Freq: Once | INTRAMUSCULAR | Status: DC | PRN

## 2020-01-13 MED ORDER — ACETAMINOPHEN 325 MG PO TABS: 650.0000 mg | ORAL_TABLET | ORAL | Status: DC | PRN

## 2020-01-13 MED ORDER — LACTATED RINGERS IV SOLN: 500.0000 mL | INTRAVENOUS | Status: DC | PRN

## 2020-01-13 MED ORDER — LIDOCAINE HCL (PF) 1 % IJ SOLN: 30.0000 mL | INTRAMUSCULAR | Status: DC | PRN

## 2020-01-13 NOTE — MAU Note (Signed)
PT SAYS HQANDS ITCHING Sunday .  UC STARTED STRONG AT 615PM.  NO VE IN FAMINA.  DENIES HSV AND MRSA. GBS- POSITIVE

## 2020-01-13 NOTE — H&P (Addendum)
OBSTETRIC ADMISSION HISTORY AND PHYSICAL  Kristen Grimes is a 29 y.o. female [phone] with IUP at [redacted]w[redacted]d by LMP presenting for SOL. She reports +FMs, No LOF, no VB, no blurry vision, peripheral edema, or RUQ pain. She reports having a HA 2 days ago. She states that this was mild and went away shortly after taking Tylenol.  She plans on bottle feeding. She requests birth control patch for birth control. She received her prenatal care at [facility].  Dating: By U/S at [redacted]w[redacted]d --->  Estimated Date of Delivery: 01/08/20  Sono:    @[redacted]w[redacted]d , CWD, normal anatomy, cephalic presentation, 422 g, EFW  Nursing Staff Provider  Office Location  [facility] Dating  U/S [redacted]w[redacted]d  Language  English Anatomy [redacted]w[redacted]d   NML   Flu Vaccine  Declined 12/13/19 Genetic Screen  NIPS: low risk female   AFP:  negative  TDaP vaccine   Declined 12/13/19 Hgb A1C or  GTT  Third trimester nl Glucose, Fasting 65 - 91 mg/dL 76   Glucose, 1 hour 65 - 179 mg/dL 86   Glucose, 2 hour 65 - 152 mg/dL 84      Rhogam  n/a   LAB RESULTS     Blood Type B/Positive/-- (04/28 1039)   Feeding Plan Both  Antibody Negative (04/28 1039)  Contraception Post placental IUD Rubella 3.38 (04/28 1039)  Circumcision Yes  RPR Non Reactive (08/27 0955)   Pediatrician  [facility] on [address] HBsAg Negative (04/28 1039)   Support Person Cedric (FOB) HCVAb Negative  Prenatal Classes Yes HIV Non Reactive (08/27 0955)     BTL Consent  GBS Negative/-- (09/28 0936) POSITIVE  VBAC Consent  Pap  12/2017- normal     Hgb Electro  AA  BP Cuff Has cuff CF neg    SMA 2 copies    Waterbirth  [ ]  Class [ ]  Consent [ ]  CNM visit    Prenatal History/Complications: None  Past Medical History: Past Medical History:  Diagnosis Date  . Anemia affecting pregnancy, antepartum 01/27/2018  . Gonorrhea affecting pregnancy 07/25/2018  . Group B streptococcal carriage complicating pregnancy 07/25/2018  . Medical history non-contributory     Past Surgical  History: Past Surgical History:  Procedure Laterality Date  . NO PAST SURGERIES      Obstetrical History: OB History    Gravida  4   Para  3   Term  3   Preterm      AB      Living  3     SAB      TAB      Ectopic      Multiple  0   Live Births  3           Social History Social History   Socioeconomic History  . Marital status: Single    Spouse name: Not on file  . Number of children: Not on file  . Years of education: Not on file  . Highest education level: Not on file  Occupational History  . Not on file  Tobacco Use  . Smoking status: Never Smoker  . Smokeless tobacco: Never Used  Vaping Use  . Vaping Use: Never used  Substance and Sexual Activity  . Alcohol use: Not Currently    Comment: occ wine- stop with pregnancy  . Drug use: No  . Sexual activity: Yes    Birth control/protection: None  Other Topics Concern  . Not on file  Social History Narrative  .  Not on file   Social Determinants of Health   Financial Resource Strain:   . Difficulty of Paying Living Expenses: Not on file  Food Insecurity:   . Worried About Programme researcher, broadcasting/film/video in the Last Year: Not on file  . Ran Out of Food in the Last Year: Not on file  Transportation Needs:   . Lack of Transportation (Medical): Not on file  . Lack of Transportation (Non-Medical): Not on file  Physical Activity:   . Days of Exercise per Week: Not on file  . Minutes of Exercise per Session: Not on file  Stress:   . Feeling of Stress : Not on file  Social Connections:   . Frequency of Communication with Friends and Family: Not on file  . Frequency of Social Gatherings with Friends and Family: Not on file  . Attends Religious Services: Not on file  . Active Member of Clubs or Organizations: Not on file  . Attends Banker Meetings: Not on file  . Marital Status: Not on file    Family History: Family History  Problem Relation Age of Onset  . Multiple sclerosis Mother      Allergies: No Known Allergies  Medications Prior to Admission  Medication Sig Dispense Refill Last Dose  . Prenat-Fe Poly-Methfol-FA-DHA (VITAFOL ULTRA) 29-0.6-0.4-200 MG CAPS Take 1 capsule by mouth daily. 30 capsule 12 01/13/2020 at Unknown time  . acetaminophen (TYLENOL) 325 MG tablet Take 2 tablets (650 mg total) by mouth every 6 (six) hours as needed for headache. (Patient not taking: Reported on 12/27/2019) 30 tablet 0   . AMOXICILLIN PO Take by mouth. (Patient not taking: Reported on 11/07/2019)     . Blood Pressure Monitoring (BLOOD PRESSURE CUFF) MISC 1 Device by Does not apply route once a week. (Patient not taking: Reported on 09/08/2019) 1 each 0   . guaiFENesin (MUCINEX) 600 MG 12 hr tablet Take by mouth once. (Patient not taking: Reported on 01/12/2020)     . hydrocortisone cream 0.5 % Apply 1 application topically 2 (two) times daily. (Patient not taking: Reported on 12/27/2019) 30 g 0   . ibuprofen (ADVIL) 600 MG tablet Take 1 tablet (600 mg total) by mouth every 6 (six) hours. (Patient not taking: Reported on 07/04/2019) 30 tablet 0      Review of Systems   All systems reviewed and negative except as stated in HPI  Blood pressure 126/78, pulse 94, temperature 98.8 F (37.1 C), temperature source Oral, resp. rate 20, height 5\' 8"  (1.727 m), weight 87.3 kg, last menstrual period 04/27/2019, not currently breastfeeding. General appearance: alert, cooperative and mild distress Lungs: normal work of breathing Heart: 1+ pulses bilaterally, perfusing well Abdomen: soft, non-tender; bowel sounds normal Extremities: Homans sign is negative, no sign of DVT Presentation: cephalic Fetal monitoringBaseline: 140 bpm, Variability: Moderate, Accelerations: Present and Decelerations: Early Uterine activityFrequency: Every 4-5 minutes Dilation: 6.5 Effacement (%): 100 Station: -1 Exam by:: [email or web address] RN   Prenatal labs: ABO, Rh: B/Positive/-- (04/28 1039) Antibody: Negative  (04/28 1039) Rubella: 3.38 (04/28 1039) RPR: Non Reactive (08/27 0955)  HBsAg: Negative (04/28 1039)  HIV: Non Reactive (08/27 0955)  GBS: Negative/-- (09/28 0936)  1 hr Glucola: 86 (8/27 0955) Genetic screening: Carrier SMA Anatomy 06-08-2003: Normal  Prenatal Transfer Tool  Maternal Diabetes: No Genetic Screening: Abnormal:  Results: Other: Carrier SMA Maternal Ultrasounds/Referrals: Normal Fetal Ultrasounds or other Referrals:  None Maternal Substance Abuse:  No Significant Maternal Medications:  None Significant Maternal Lab  Results: Group B Strep negative  Results for orders placed or performed during the hospital encounter of 01/13/20 (from the past 24 hour(s))  CBC   Collection Time: 01/13/20  8:36 PM  Result Value Ref Range   WBC 6.5 4.0 - 10.5 K/uL   RBC 4.08 3.87 - 5.11 MIL/uL   Hemoglobin 12.6 12.0 - 15.0 g/dL   HCT 68.1 36 - 46 %   MCV 91.7 80.0 - 100.0 fL   MCH 30.9 26.0 - 34.0 pg   MCHC 33.7 30.0 - 36.0 g/dL   RDW 27.5 17.0 - 01.7 %   Platelets 244 150 - 400 K/uL   nRBC 0.0 0.0 - 0.2 %  Results for orders placed or performed during the hospital encounter of 01/13/20 (from the past 24 hour(s))  SARS CORONAVIRUS 2 (TAT 6-24 HRS) Nasopharyngeal Nasopharyngeal Swab   Collection Time: 01/13/20  9:32 AM   Specimen: Nasopharyngeal Swab  Result Value Ref Range   SARS Coronavirus 2 NEGATIVE NEGATIVE    Patient Active Problem List   Diagnosis Date Noted  . [redacted] weeks gestation of pregnancy 01/12/2020  . Ed[place]ly interval between pregnancies affecting pregnancy, antepartum 07/13/2019  . Supervision of other normal pregnancy, antepartum 07/04/2019  . GBS (group B Streptococcus carrier), +RV culture, currently pregnant 07/25/2018  . Seasonal rhinitis 06/18/2013    Assessment/Plan:  Lilie Vezina is a 29 y.o. G4P3003 at [redacted]w[redacted]d here for SOL. Pt is doing well but in moderate pain, epidural placed minutes ago. Hx of rapid progression with delivery in prior pregnancies. Anticipate  imminent SVD.  #Labor: Progressing well. At 9/90/-1 on recheck in the room. #Pain: Epidural, placed shortly before exam with minimal effect thus far #FWB: Cat 1 #ID:  GBS Negative #MOF: Bottle #MOC:Patch #Circ:  Yes  Nonah Mattes, Medical Student  01/13/2020, 8:53 PM  CNM attestation:  I have seen and examined this patient; I agree with above documentation in the med student's note.   Perlie Stene is a 28 y.o. [phone] here for SOL/active labor  PE: BP 112/69   Pulse 88   Temp 98 F (36.7 C) (Oral)   Resp 20   Ht 5\' 8"  (1.727 m)   Wt 87.3 kg   LMP 04/27/2019 (LMP Unknown)   SpO2 100%   BMI 29.25 kg/m  Gen: breathing w ctx Resp: normal effort, no distress Abd: gravid  ROS, labs, PMH reviewed  Plan: Admit to Labor & Delivery Expectant management Anticipate vag del  06/25/2019 CNM 01/13/2020, 10:53 PM

## 2020-01-13 NOTE — Anesthesia Preprocedure Evaluation (Signed)
Anesthesia Evaluation  Patient identified by MRN, date of birth, ID band Patient awake    Reviewed: Allergy & Precautions, H&P , NPO status , Patient's Chart, lab work & pertinent test results, reviewed documented beta blocker date and time   Airway Mallampati: II  TM Distance: >3 FB Neck ROM: full    Dental no notable dental hx. (+) Teeth Intact, Dental Advisory Given   Pulmonary neg pulmonary ROS,    Pulmonary exam normal breath sounds clear to auscultation       Cardiovascular negative cardio ROS Normal cardiovascular exam Rhythm:regular Rate:Normal     Neuro/Psych negative neurological ROS  negative psych ROS   GI/Hepatic negative GI ROS, Neg liver ROS,   Endo/Other  negative endocrine ROS  Renal/GU negative Renal ROS  negative genitourinary   Musculoskeletal   Abdominal   Peds  Hematology  (+) Blood dyscrasia, anemia ,   Anesthesia Other Findings   Reproductive/Obstetrics (+) Pregnancy                             Anesthesia Physical Anesthesia Plan  ASA: II  Anesthesia Plan: Epidural   Post-op Pain Management:    Induction:   PONV Risk Score and Plan:   Airway Management Planned:   Additional Equipment:   Intra-op Plan:   Post-operative Plan:   Informed Consent: I have reviewed the patients History and Physical, chart, labs and discussed the procedure including the risks, benefits and alternatives for the proposed anesthesia with the patient or authorized representative who has indicated his/her understanding and acceptance.     Dental Advisory Given  Plan Discussed with:   Anesthesia Plan Comments: (Labs checked- platelets confirmed with RN in room. Fetal heart tracing, per RN, reported to be stable enough for sitting procedure. Discussed epidural, and patient consents to the procedure:  included risk of possible headache,backache, failed block, allergic reaction,  and nerve injury. This patient was asked if she had any questions or concerns before the procedure started.)        Anesthesia Quick Evaluation

## 2020-01-13 NOTE — Progress Notes (Signed)
Bladder scan revealed , patient in/out cathed post delivery by Philipp Deputy CNM.

## 2020-01-13 NOTE — Discharge Summary (Signed)
Postpartum Discharge Summary    Patient Name: Kristen Grimes DOB: Sep 09, 1990 MRN: [id number]  Date of admission: 01/13/2020 Delivery date:01/13/2020  Delivering provider: Serita Grammes D  Date of discharge: 01/15/2020  Admitting diagnosis: Post term pregnancy at [redacted] weeks gestation [O48.0, Z3A.41] Spontaneous vaginal delivery [O80] Intrauterine pregnancy: [redacted]w[redacted]d    Secondary diagnosis:  Active Problems:   Fraley interval between pregnancies affecting pregnancy, antepartum   [redacted] weeks gestation of pregnancy   Spontaneous vaginal delivery  Additional problems: none    Discharge diagnosis: Term Pregnancy Delivered                                              Post partum procedures:none Augmentation: none Complications: None  Hospital course: Onset of Labor With Vaginal Delivery      29y.o. yo [id number] admitted in Active Labor on 01/13/2020. Patient had an uncomplicated labor course, progressing to SVD <2hrs after arrival to L&D.  Membrane Rupture Time/Date: 10:09 PM ,01/13/2020   Delivery Method:Vaginal, Spontaneous  Episiotomy: None  Lacerations:  None  Patient had an uncomplicated postpartum course.  She is ambulating, tolerating a regular diet, passing flatus, and urinating well. Patient is discharged home in stable condition on 01/15/20.  Newborn Data: Birth date:01/13/2020  Birth time:10:15 PM  Gender:Female  Living status:Living  Apgars:9 ,9  Weight:3374 g (7lb 7oz)  Magnesium Sulfate received: No BMZ received: No Rhophylac:N/A MMR:N/A T-DaP:declined prenatally Flu: No Transfusion:No  Physical exam  Vitals:   01/14/20 0506 01/14/20 1400 01/14/20 2043 01/15/20 0606  BP: 122/69 123/75 123/70 (!) 98/51  Pulse: 79 80 81 63  Resp: '18 18 18 18  ' Temp: 98.2 F (36.8 C) 98.5 F (36.9 C) 98.5 F (36.9 C) 98.4 F (36.9 C)  TempSrc: Oral Oral Oral Oral  SpO2: 98% 98%  100%  Weight:      Height:       General: alert, cooperative and no  distress Lochia: appropriate Uterine Fundus: firm Incision: N/A DVT Evaluation: No evidence of DVT seen on physical exam. Labs: Lab Results  Component Value Date   WBC 6.5 01/13/2020   HGB 12.6 01/13/2020   HCT 37.4 01/13/2020   MCV 91.7 01/13/2020   PLT 244 01/13/2020   No flowsheet data found. Edinburgh Score: Edinburgh Postnatal Depression Scale Screening Tool 01/14/2020  I have been able to laugh and see the funny side of things. 0  I have looked forward with enjoyment to things. 0  I have blamed myself unnecessarily when things went wrong. 0  I have been anxious or worried for no good reason. 0  I have felt scared or panicky for no good reason. 0  Things have been getting on top of me. 0  I have been so unhappy that I have had difficulty sleeping. 0  I have felt sad or miserable. 0  I have been so unhappy that I have been crying. 0  The thought of harming myself has occurred to me. 0  Edinburgh Postnatal Depression Scale Total 0     After visit meds:  Allergies as of 01/15/2020   No Known Allergies     Medication List    STOP taking these medications   AMOXICILLIN PO   guaiFENesin 600 MG 12 hr tablet Commonly known as: MUHornbeckhese medications   acetaminophen  325 MG tablet Commonly known as: TYLENOL Take 2 tablets (650 mg total) by mouth every 6 (six) hours as needed for headache.   Blood Pressure Cuff Misc 1 Device by Does not apply route once a week.   hydrocortisone cream 0.5 % Apply 1 application topically 2 (two) times daily.   ibuprofen 600 MG tablet Commonly known as: ADVIL Take 1 tablet (600 mg total) by mouth every 6 (six) hours.   Vitafol Ultra 29-0.6-0.4-200 MG Caps Take 1 capsule by mouth daily.        Discharge home in stable condition Infant Feeding: Bottle Infant Disposition:home with mother Discharge instruction: per After Visit Summary and Postpartum booklet. Activity: Advance as tolerated. Pelvic rest for 6 weeks.   Diet: routine diet Future Appointments:No future appointments. Follow up Visit:  [facility]. Schedule an appointment as soon as possible for a visit in 4 week(s).   Specialty: Obstetrics and Gynecology Why: for your postpartum appointment Contact information: [address] [place] [phone]              Myrtis Ser, [place] Please schedule this patient for Postpartum visit in: 4 weeks with the following provider: Any provider  In-Person  For C/S patients schedule nurse incision check in weeks 2 weeks: no  Low risk pregnancy complicated by: none  Delivery mode: SVD  Anticipated Birth Control: patch  PP Procedures needed: none  Schedule Integrated BH visit: no    01/15/2020 Janet Berlin, MD

## 2020-01-13 NOTE — Discharge Instructions (Signed)

## 2020-01-13 NOTE — Anesthesia Procedure Notes (Signed)
Epidural Patient location during procedure: OB Start time: 01/13/2020 9:25 PM End time: 01/13/2020 9:30 PM  Staffing Anesthesiologist: Bethena Midget, MD  Preanesthetic Checklist Completed: patient identified, IV checked, site marked, risks and benefits discussed, surgical consent, monitors and equipment checked, pre-op evaluation and timeout performed  Epidural Patient position: sitting Prep: DuraPrep and site prepped and draped Patient monitoring: continuous pulse ox and blood pressure Approach: midline Location: L3-L4 Injection technique: LOR air  Needle:  Needle type: Tuohy  Needle gauge: 17 G Needle length: 9 cm and 9 Needle insertion depth: 6 cm Catheter type: closed end flexible Catheter size: 19 Gauge Catheter at skin depth: 11 cm Test dose: negative  Assessment Events: blood not aspirated, injection not painful, no injection resistance, no paresthesia and negative IV test

## 2020-01-14 ENCOUNTER — Encounter (HOSPITAL_COMMUNITY): Payer: Self-pay | Admitting: Obstetrics and Gynecology

## 2020-01-14 LAB — RPR: RPR Ser Ql: NONREACTIVE

## 2020-01-14 MED ORDER — BENZOCAINE-MENTHOL 20-0.5 % EX AERO: 1.0000 "application " | INHALATION_SPRAY | CUTANEOUS | Status: DC | PRN

## 2020-01-14 MED ORDER — ONDANSETRON HCL 4 MG PO TABS: 4.0000 mg | ORAL_TABLET | ORAL | Status: DC | PRN

## 2020-01-14 MED ORDER — SIMETHICONE 80 MG PO CHEW: 80.0000 mg | CHEWABLE_TABLET | ORAL | Status: DC | PRN

## 2020-01-14 MED ORDER — TETANUS-DIPHTH-ACELL PERTUSSIS 5-2.5-18.5 LF-MCG/0.5 IM SUSY: 0.5000 mL | PREFILLED_SYRINGE | Freq: Once | INTRAMUSCULAR | Status: DC

## 2020-01-14 MED ORDER — WITCH HAZEL-GLYCERIN EX PADS: 1.0000 "application " | MEDICATED_PAD | CUTANEOUS | Status: DC | PRN

## 2020-01-14 MED ORDER — DIPHENHYDRAMINE HCL 25 MG PO CAPS: 25.0000 mg | ORAL_CAPSULE | Freq: Four times a day (QID) | ORAL | Status: DC | PRN

## 2020-01-14 MED ORDER — DIBUCAINE (PERIANAL) 1 % EX OINT: 1.0000 "application " | TOPICAL_OINTMENT | CUTANEOUS | Status: DC | PRN

## 2020-01-14 MED ORDER — ONDANSETRON HCL 4 MG/2ML IJ SOLN: 4.0000 mg | INTRAMUSCULAR | Status: DC | PRN

## 2020-01-14 MED ORDER — COCONUT OIL OIL: 1.0000 "application " | TOPICAL_OIL | Status: DC | PRN

## 2020-01-14 MED ORDER — PRENATAL MULTIVITAMIN CH
1.0000 | ORAL_TABLET | Freq: Every day | ORAL | Status: DC
  Administered 2020-01-14 – 2020-01-15 (×2): 1 via ORAL
  Filled 2020-01-14 (×2): qty 1

## 2020-01-14 MED ORDER — IBUPROFEN 600 MG PO TABS
600.0000 mg | ORAL_TABLET | Freq: Four times a day (QID) | ORAL | Status: DC
  Administered 2020-01-14 – 2020-01-15 (×5): 600 mg via ORAL
  Filled 2020-01-14 (×6): qty 1

## 2020-01-14 MED ORDER — SENNOSIDES-DOCUSATE SODIUM 8.6-50 MG PO TABS
2.0000 | ORAL_TABLET | ORAL | Status: DC
  Filled 2020-01-14: qty 2

## 2020-01-14 MED ORDER — ACETAMINOPHEN 325 MG PO TABS: 650.0000 mg | ORAL_TABLET | ORAL | Status: DC | PRN

## 2020-01-14 MED ORDER — MEASLES, MUMPS & RUBELLA VAC IJ SOLR: 0.5000 mL | Freq: Once | INTRAMUSCULAR | Status: DC

## 2020-01-14 NOTE — Progress Notes (Signed)
POSTPARTUM PROGRESS NOTE  Post Partum Day 1  Subjective:  Kristen Grimes is a 29 y.o. [id number] s/p SVD at [redacted]w[redacted]d.  No acute events overnight.  Pt denies problems with ambulating, voiding or po intake.  She denies nausea or vomiting.  Pain is well controlled.  She has had flatus. She has not had bowel movement.  Lochia Small.   Objective: Blood pressure 122/69, pulse 79, temperature 98.2 F (36.8 C), temperature source Oral, resp. rate 18, height 5\' 8"  (1.727 m), weight 87.3 kg, last menstrual period 04/27/2019, SpO2 98 %, unknown if currently breastfeeding.  Physical Exam:  General: alert, cooperative and no distress Chest: no respiratory distress Heart:regular rate, normal heart sounds Abdomen: soft, nontender Uterine Fundus: firm, appropriately tender DVT Evaluation: No calf swelling or tenderness Extremities: no edema Skin: warm, dry  Recent Labs    01/13/20 2036  HGB 12.6  HCT 37.4    Assessment/Plan: Kristen Grimes is a 29 y.o. 30 s/p SVD at [redacted]w[redacted]d. Pt is doing well with no complaints, concerns, or comments  PPD# 1 - Doing well. Afebrile with good pain control. Contraception: Hormone patch at postpartum visit. Feeding: Bottle Dispo: Plan for discharge tomorrow. Circ: Plan for today or tomorrow prior to d/c.   LOS: 1 day   [redacted]w[redacted]d, Medical Student, CNM 01/14/2020, 7:14 AM

## 2020-01-14 NOTE — Anesthesia Postprocedure Evaluation (Signed)
Anesthesia Post Note  Patient: Public librarian  Procedure(s) Performed: AN AD HOC LABOR EPIDURAL     Patient location during evaluation: Mother Baby Anesthesia Type: Epidural Level of consciousness: awake and alert Pain management: pain level controlled Vital Signs Assessment: post-procedure vital signs reviewed and stable Respiratory status: spontaneous breathing, nonlabored ventilation and respiratory function stable Cardiovascular status: stable Postop Assessment: no headache, no backache and epidural receding Anesthetic complications: no   No complications documented.  Last Vitals:  Vitals:   01/14/20 0015 01/14/20 0107  BP: 115/78 122/75  Pulse: 69 86  Resp: 18 18  Temp: 36.7 C 36.7 C  SpO2: 100% 100%    Last Pain:  Vitals:   01/14/20 0107  TempSrc: Oral  PainSc:    Pain Goal:                Epidural/Spinal Function Cutaneous sensation: Normal sensation (01/14/20 0115)  Ludmila Ebarb

## 2020-01-15 ENCOUNTER — Inpatient Hospital Stay (HOSPITAL_COMMUNITY): Payer: Medicaid Other

## 2020-01-15 ENCOUNTER — Inpatient Hospital Stay (HOSPITAL_COMMUNITY)
Admission: AD | Admit: 2020-01-15 | Payer: Medicaid Other | Source: Home / Self Care | Admitting: Obstetrics and Gynecology

## 2020-02-13 ENCOUNTER — Telehealth (INDEPENDENT_AMBULATORY_CARE_PROVIDER_SITE_OTHER): Payer: Medicaid Other | Admitting: Advanced Practice Midwife

## 2020-02-13 DIAGNOSIS — Z3045 Encounter for surveillance of transdermal patch hormonal contraceptive device: Secondary | ICD-10-CM | POA: Insufficient documentation

## 2020-02-13 DIAGNOSIS — Z3009 Encounter for other general counseling and advice on contraception: Secondary | ICD-10-CM

## 2020-02-13 DIAGNOSIS — Z30016 Encounter for initial prescription of transdermal patch hormonal contraceptive device: Secondary | ICD-10-CM

## 2020-02-13 MED ORDER — XULANE 150-35 MCG/24HR TD PTWK: 1.0000 | MEDICATED_PATCH | TRANSDERMAL | 12 refills | Status: DC

## 2020-02-13 NOTE — Progress Notes (Signed)
I connected with  Kristen Grimes on 02/13/20 by a video enabled telemedicine application and verified that I am speaking with the correct person using two identifiers.   I discussed the limitations of evaluation and management by telemedicine. The patient expressed understanding and agreed to proceed.   Post Partum Visit Note  Kristen Grimes is a 29 y.o. G84P4004 female who presents for a postpartum visit. She is 4 weeks postpartum following a normal spontaneous vaginal delivery.  I have fully reviewed the prenatal and intrapartum course. The delivery was at 40.5 gestational weeks.  Anesthesia: epidural. Postpartum course has been doing well. Baby is doing well. Baby is feeding by bottle - Geber Gentle. Bleeding staining only. Bowel function is normal. Bladder function is normal. Patient is sexually active. Contraception method is none. Pt is interested in Patch.   Postpartum depression screening: negative, score 0.   The pregnancy intention screening data noted above was reviewed. Potential methods of contraception were discussed. The patient elected to proceed with Contraceptive Patch.    Edinburgh Postnatal Depression Scale - 02/13/20 1047      Edinburgh Postnatal Depression Scale:  In the Past 7 Days   I have been able to laugh and see the funny side of things. 0    I have looked forward with enjoyment to things. 0    I have blamed myself unnecessarily when things went wrong. 0    I have been anxious or worried for no good reason. 0    I have felt scared or panicky for no good reason. 0    Things have been getting on top of me. 0    I have been so unhappy that I have had difficulty sleeping. 0    I have felt sad or miserable. 0    I have been so unhappy that I have been crying. 0    The thought of harming myself has occurred to me. 0    Edinburgh Postnatal Depression Scale Total 0            The following portions of the patient's history were reviewed and updated as  appropriate: allergies, current medications, past family history, past medical history, past social history, past surgical history and problem list.  Review of Systems Pertinent items noted in HPI and remainder of comprehensive ROS otherwise negative.    Objective:  LMP 04/27/2019 (LMP Unknown)    General:  Alert, oriented and cooperative. Patient appears to be in no acute distress.  Mental Status: Normal mood and affect. Normal behavior. Normal judgment and thought content.   Respiratory: Normal respiratory effort, no problems with respiration noted  Rest of physical exam deferred due to type of encounter       Assessment:    Normal postpartum exam.  1. Encounter for counseling regarding contraception Discussed LARCs as most effective forms of birth control.  Discussed benefits/risks of other methods.  Pt desires contraceptive patch.  Discussed failure rates of OCPs with regular use. Patch with lower failure rates.  Reviewed risk factors and s/sx of DVT/PE/reasons to seek medical care.    - norelgestromin-ethinyl estradiol [employer]) 150-35 MCG/24HR transdermal patch; Place 1 patch onto the skin once a week.  Dispense: 3 patch; Refill: 12  --F/U for contraceptive visit with new contraceptive method in 3 months  2. Postpartum care following vaginal delivery   Plan:   Essential components of care per ACOG recommendations:  1.  Mood and well being: Patient with negative depression screening  today. Reviewed local resources for support.  - Patient does not use tobacco.  - hx of drug use? No   2. Infant care and feeding:  -Patient currently breastmilk feeding? No  --No breast problems -Social determinants of health (SDOH) reviewed in EPIC. No concerns  3. Sexuality, contraception and birth spacing - Patient does not want a pregnancy in the next year.   - Reviewed forms of contraception in tiered fashion. Patient desired Xulane patch today.   - Discussed birth spacing of 18  months  4. Sleep and fatigue -Encouraged family/partner/community support of 4 hrs of uninterrupted sleep to help with mood and fatigue  5. Physical Recovery  - Discussed patients delivery without compllications - Patient had no lacerations, perineal healing reviewed. Patient expressed understanding - Patient has urinary incontinence? No - Patient is safe to resume physical and sexual activity  6.  Health Maintenance - Last pap smear done 01/03/2018 and was normal     [name], CNM [facility] [employer], [facility]

## 2020-03-28 ENCOUNTER — Other Ambulatory Visit: Payer: Self-pay

## 2020-03-28 MED ORDER — PREPLUS 27-1 MG PO TABS: 1.0000 | ORAL_TABLET | Freq: Every day | ORAL | 13 refills | Status: DC

## 2020-04-29 IMAGING — US US OB COMP LESS 14 WK
2 series · 15 of 28 positions shown · non-contrast
Comparison: None relevant.

CLINICAL DATA: 28-year-old female with uncertain dates. Estimated
gestational age by LMP 10 weeks and 5 days.

EXAM:
OBSTETRIC <14 WK ULTRASOUND
TECHNIQUE: Transabdominal ultrasound was performed for evaluation of the
gestation as well as the maternal uterus and adnexal regions.

[Series 1: us ob comp less 14 wk · 32 acquisitions, 14 frames shown (1 of 2)]
[im 1/32]
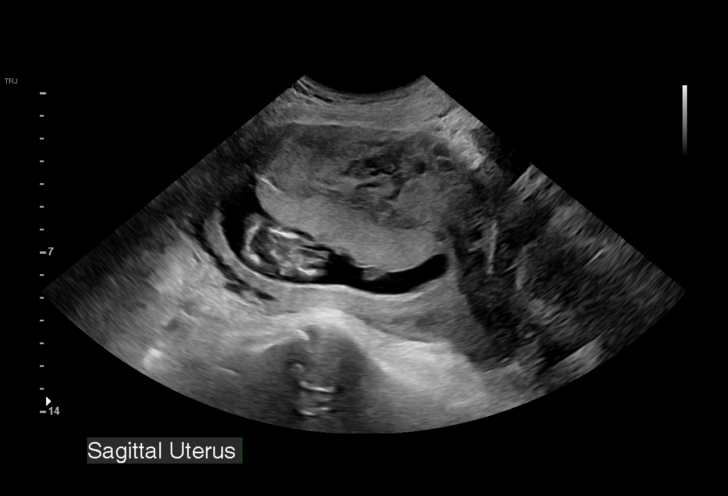
[im 3/32]
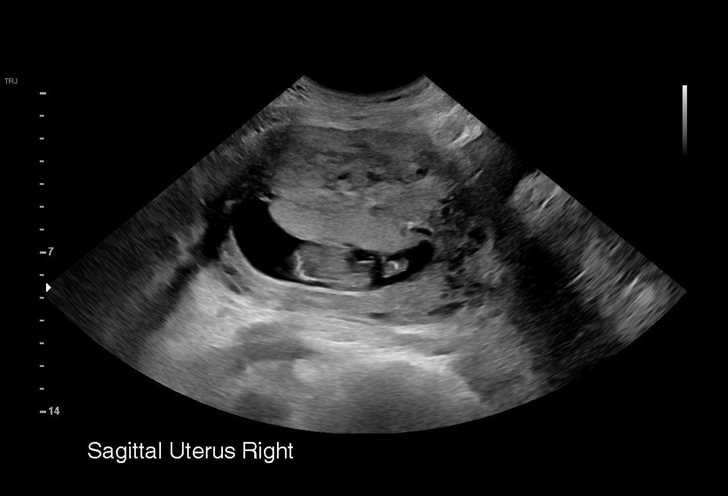
[im 5/32]
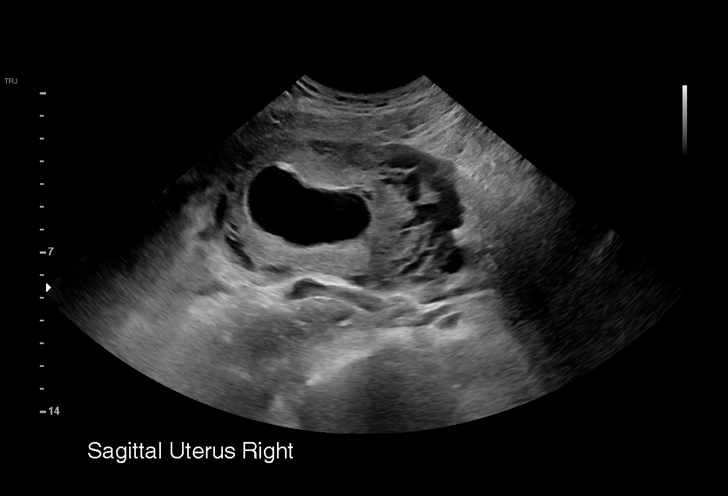
[im 8/32]
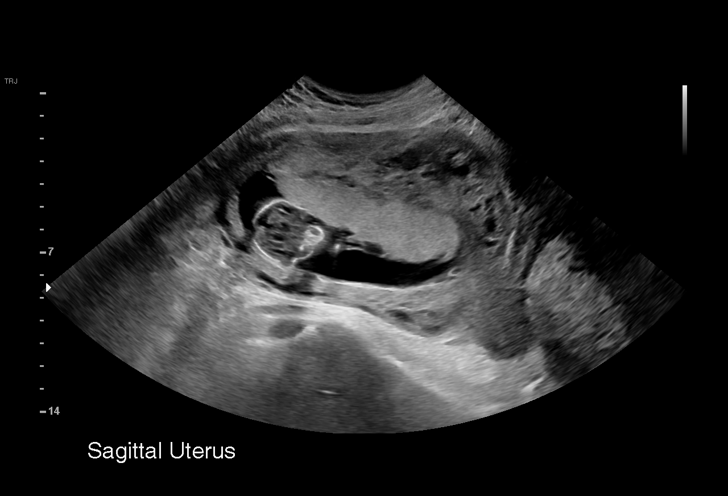
[im 10/32]
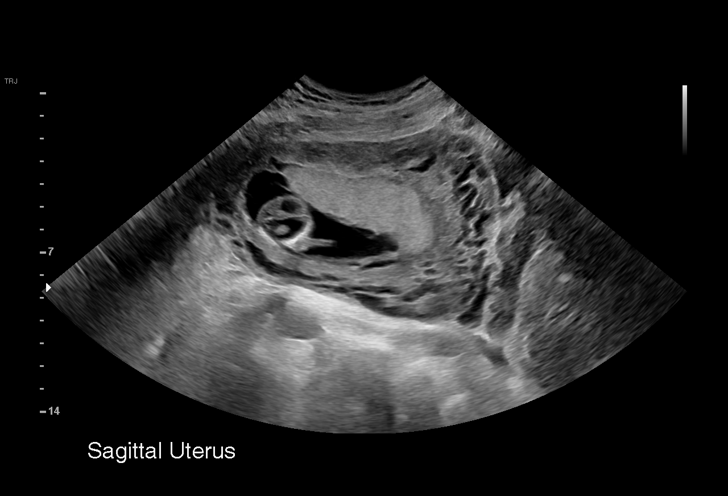
[im 13/32]
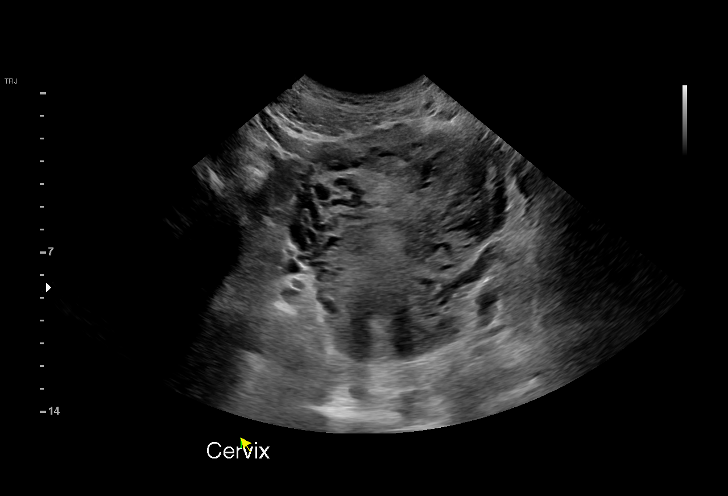
[im 15/32]
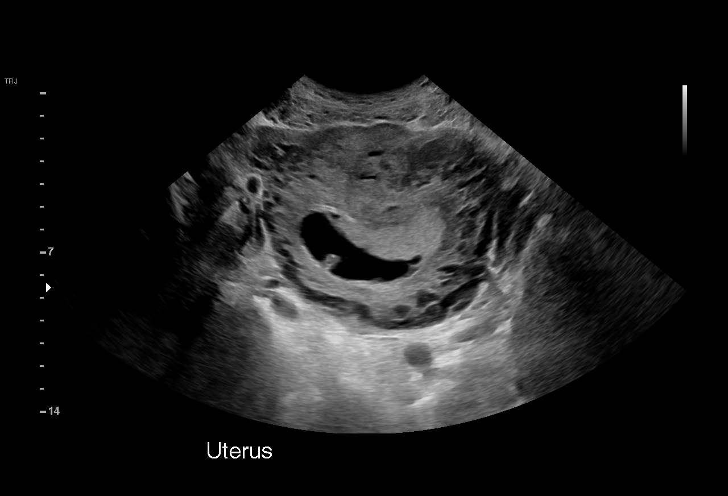
[im 18/32]
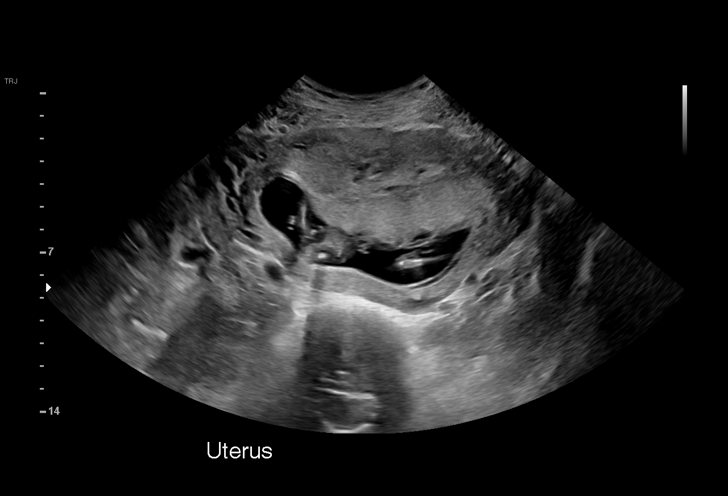
[im 19/32]
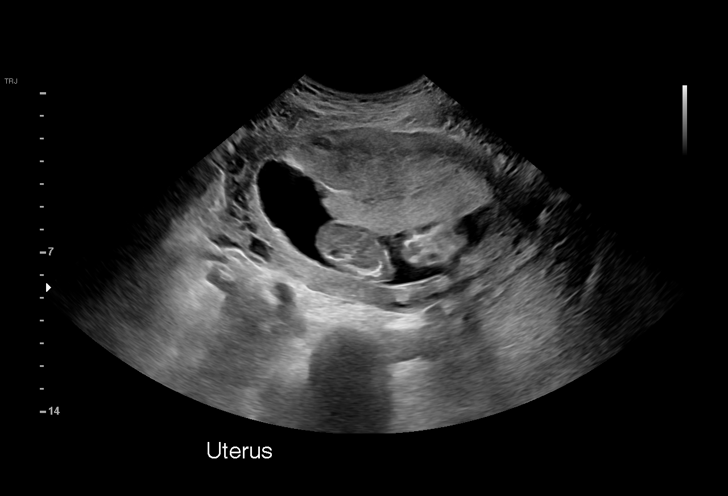
[im 22/32]
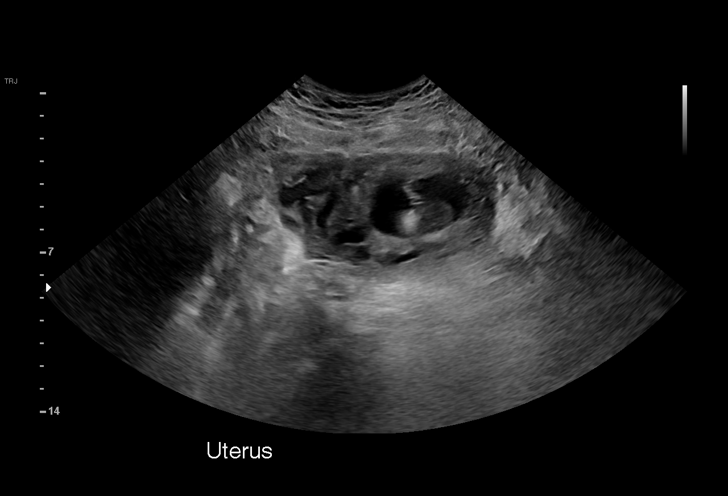
[im 24/32]
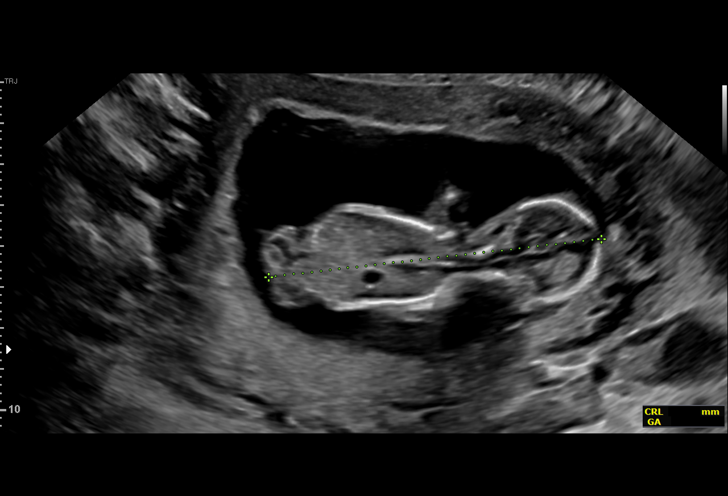
[im 27/32]
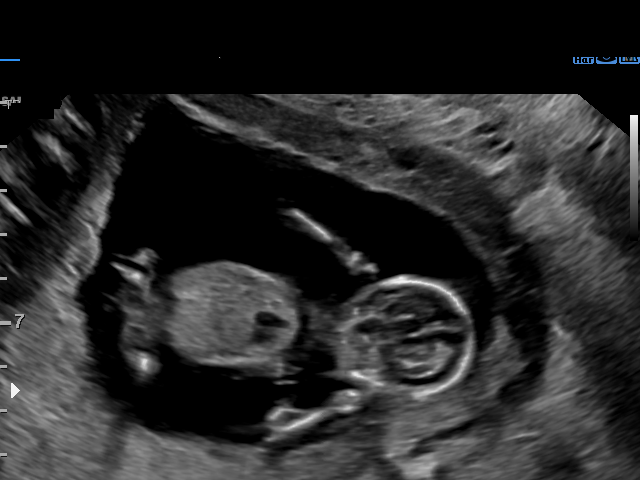
[im 29/32]
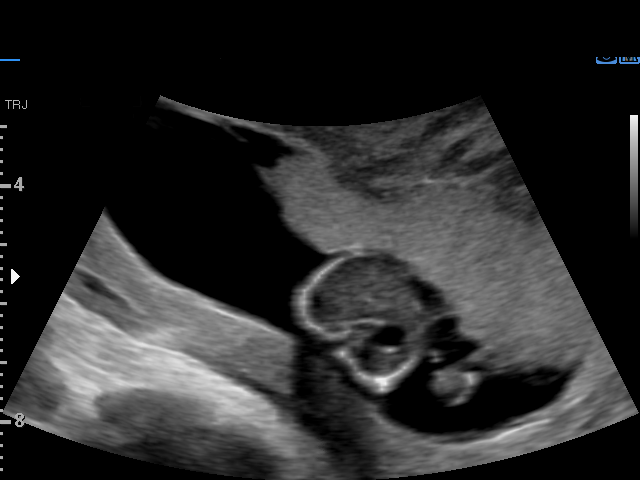
[im 32/32]
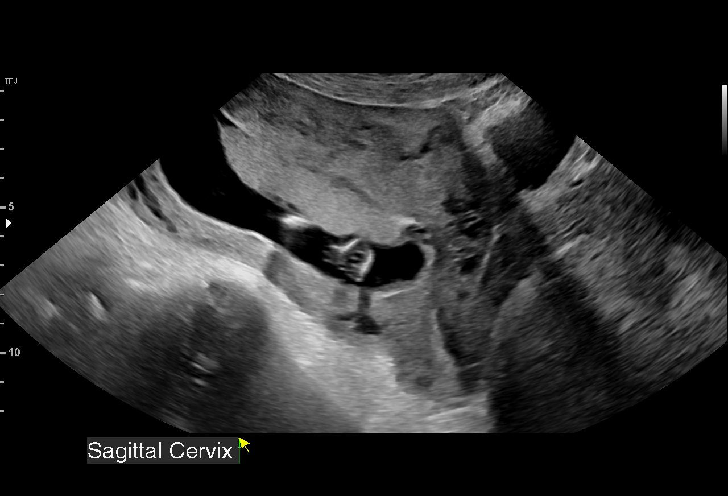

[Series 2: us ob comp less 14 wk · 1 of 2 slices shown (2 of 2)]
[im 2/2]
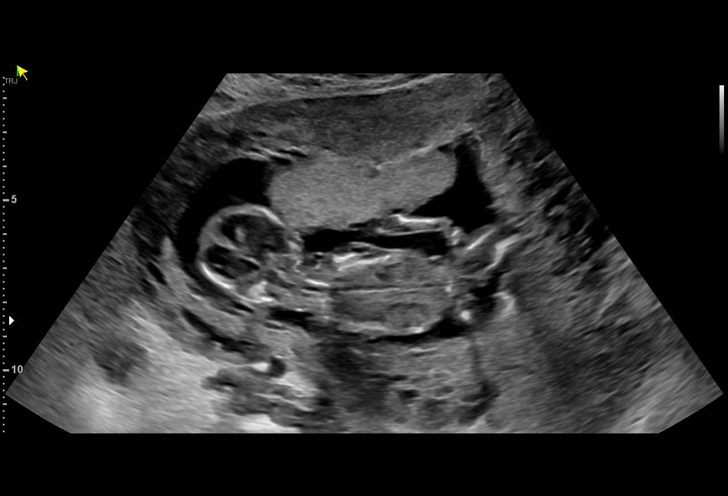

[15 of 28 positions shown; findings below may reference images not displayed]

FINDINGS: Intrauterine gestational sac: Single

Yolk sac:  Not visible

Embryo:  Visible

Cardiac Activity: Detected

Heart Rate: 161 bpm

CRL:   81.7 mm   14 w 1 d                  US EDC: 01/08/2020

Subchorionic hemorrhage:  None visualized.

Maternal uterus/adnexae: No pelvic free fluid.

Both ovaries appear normal with possible corpus luteum on the left.
The right ovary is 2.9 x 1.2 x 1.9 cm. The left is 3.0 x 2.0 x
cm.
IMPRESSION: Single living IUP demonstrated with estimated gestational age of 14
weeks and 1 day by crown-rump length.

## 2020-09-13 ENCOUNTER — Encounter (HOSPITAL_COMMUNITY): Payer: Self-pay

## 2020-09-13 ENCOUNTER — Ambulatory Visit (HOSPITAL_COMMUNITY)
Admission: EM | Admit: 2020-09-13 | Discharge: 2020-09-13 | Disposition: A | Discharge status: Home / Self Care | Payer: Medicaid Other | Attending: Emergency Medicine | Admitting: Emergency Medicine

## 2020-09-13 ENCOUNTER — Other Ambulatory Visit: Payer: Self-pay

## 2020-09-13 DIAGNOSIS — H5789 Other specified disorders of eye and adnexa: Secondary | ICD-10-CM

## 2020-09-13 MED ORDER — VALACYCLOVIR HCL 1 G PO TABS: 1000.0000 mg | ORAL_TABLET | Freq: Three times a day (TID) | ORAL | 0 refills | Status: AC

## 2020-09-13 MED ORDER — FLUORESCEIN SODIUM 1 MG OP STRP
ORAL_STRIP | OPHTHALMIC | Status: AC
  Filled 2020-09-13: qty 1

## 2020-09-13 NOTE — ED Provider Notes (Signed)
[facility]    CSN: [id number] Arrival date & time: 09/13/20  1022      History   Chief Complaint Chief Complaint  Patient presents with   Eye Pain    HPI Kristen Grimes is a 30 y.o. female.   Patient here for evaluation of right eye irritation and pain that yesterday.  Reports putting in new contacts on Friday but removed them last night when the irritation started, typically wears contacts for a week.  Denies getting anything in her eye.  Reports having similar symptoms a year ago and was diagnosed with shingles.  Reports noticing a few bumps this am.  Denies any trauma, injury, or other precipitating event.  Denies any specific alleviating or aggravating factors.  Denies any fevers, chest pain, shortness of breath, N/V/D, numbness, tingling, weakness, abdominal pain, or headaches.     The history is provided by the patient.  Eye Pain   Past Medical History:  Diagnosis Date   Anemia affecting pregnancy, antepartum 01/27/2018   GBS (group B Streptococcus carrier), +RV culture, currently pregnant 07/25/2018   Gonorrhea affecting pregnancy 07/25/2018   Group B streptococcal carriage complicating pregnancy 07/25/2018   Medical history non-contributory     Patient Active Problem List   Diagnosis Date Noted   Contraceptive patch status 02/13/2020   Seasonal rhinitis 06/18/2013    Past Surgical History:  Procedure Laterality Date   NO PAST SURGERIES      OB History     Gravida  4   Para  4   Term  4   Preterm      AB      Living  4      SAB      IAB      Ectopic      Multiple  0   Live Births  4            Home Medications    Prior to Admission medications   Medication Sig Start Date End Date Taking? Authorizing Provider  valACYclovir (VALTREX) 1000 MG tablet Take 1 tablet (1,000 mg total) by mouth 3 (three) times daily for 7 days. 09/13/20 09/20/20 Yes Ivette Loyal, NP  acetaminophen (TYLENOL) 325 MG tablet Take 2 tablets (650 mg  total) by mouth every 6 (six) hours as needed for headache. Patient not taking: Reported on 12/27/2019 07/08/19   Dahlia Byes A, NP  hydrocortisone cream 0.5 % Apply 1 application topically 2 (two) times daily. Patient not taking: Reported on 12/27/2019 12/13/19   [name] A, CNM  ibuprofen (ADVIL) 600 MG tablet Take 1 tablet (600 mg total) by mouth every 6 (six) hours. Patient not taking: Reported on 07/04/2019 07/27/18   Tereso Newcomer, MD  norelgestromin-ethinyl estradiol [facility]) 150-35 MCG/24HR transdermal patch Place 1 patch onto the skin once a week. 02/27/20   Leftwich-Kirby, Wilmer Floor, CNM  Prenat-Fe Poly-Methfol-FA-DHA (VITAFOL ULTRA) 29-0.6-0.4-200 MG CAPS Take 1 capsule by mouth daily. 07/13/19   Sharyon Cable, CNM  Prenatal Vit-Fe Fumarate-FA (PREPLUS) 27-1 MG TABS Take 1 tablet by mouth daily. 03/28/20   Brock Bad, MD    Family History Family History  Problem Relation Age of Onset   Multiple sclerosis Mother     Social History Social History   Tobacco Use   Smoking status: Never   Smokeless tobacco: Never  Vaping Use   Vaping Use: Never used  Substance Use Topics   Alcohol use: Not Currently    Comment: occ  wine- stop with pregnancy   Drug use: No     Allergies   Patient has no known allergies.   Review of Systems Review of Systems  Eyes:  Positive for pain. Negative for photophobia, redness and itching.  All other systems reviewed and are negative.   Physical Exam Triage Vital Signs ED Triage Vitals  Enc Vitals Group     BP 09/13/20 1052 100/63     Pulse Rate 09/13/20 1052 71     Resp 09/13/20 1052 16     Temp 09/13/20 1052 98.6 F (37 C)     Temp Source 09/13/20 1052 Oral     SpO2 09/13/20 1052 98 %     Weight --      Height --      Head Circumference --      Peak Flow --      Pain Score 09/13/20 1048 0     Pain Loc --      Pain Edu? --      Excl. in GC? --    No data found.  Updated Vital Signs BP 100/63 (BP  Location: Left Arm)   Pulse 71   Temp 98.6 F (37 C) (Oral)   Resp 16   LMP 09/01/2020   SpO2 98%   Visual Acuity Right Eye Distance:   Left Eye Distance:   Bilateral Distance:    Right Eye Near:   Left Eye Near:    Bilateral Near:     Physical Exam Vitals and nursing note reviewed.  Constitutional:      General: She is not in acute distress.    Appearance: Normal appearance. She is not ill-appearing, toxic-appearing or diaphoretic.  HENT:     Head: Normocephalic and atraumatic.  Eyes:     General: Lids are normal. Lids are everted, no foreign bodies appreciated.        Right eye: No foreign body, discharge or hordeolum.     Conjunctiva/sclera: Conjunctivae normal.     Pupils: Pupils are equal, round, and reactive to light.     Right eye: No fluorescein uptake.     Comments: Small, single lesion under right eye  Cardiovascular:     Rate and Rhythm: Normal rate.     Pulses: Normal pulses.  Pulmonary:     Effort: Pulmonary effort is normal.  Abdominal:     General: Abdomen is flat.  Musculoskeletal:        General: Normal range of motion.     Cervical back: Normal range of motion.  Skin:    General: Skin is warm and dry.  Neurological:     General: No focal deficit present.     Mental Status: She is alert and oriented to person, place, and time.  Psychiatric:        Mood and Affect: Mood normal.     UC Treatments / Results  Labs (all labs ordered are listed, but only abnormal results are displayed) Labs Reviewed - No data to display  EKG   Radiology No results found.  Procedures Procedures (including critical care time)  Medications Ordered in UC Medications - No data to display  Initial Impression / Assessment and Plan / UC Course  I have reviewed the triage vital signs and the nursing notes.  Pertinent labs & imaging results that were available during my care of the patient were reviewed by me and considered in my medical decision making (see  chart for details).    Assessment negative  for red flags or concerns.  As symptoms are similar to shingles that she has had in the past go ahead and prescribe Valtrex 3 times a day for the next 7 days.  Recommend not wearing contacts for at least a week.  May take Tylenol and/or ibuprofen as needed for pain and fever.  Recommend following up with eye doctor as soon as possible for reevaluation.  Go to the emergency room for any worsening symptoms. Final Clinical Impressions(s) / UC Diagnoses   Final diagnoses:  Eye irritation     Discharge Instructions      Take the Valtrex 3 times a day for the next 7 days.    Do not put in contacts for at least a week.   You can take Tylenol and/or Ibuprofen as needed for pain relief and fever reduction.    Follow up with your eye doctor as soon as possible for re-evaluation.  Return or go to the Emergency Department if symptoms worsen or do not improve in the next few days.      ED Prescriptions     Medication Sig Dispense Auth. Provider   valACYclovir (VALTREX) 1000 MG tablet Take 1 tablet (1,000 mg total) by mouth 3 (three) times daily for 7 days. 21 tablet Ivette Loyal, NP      PDMP not reviewed this encounter.   Ivette Loyal, NP 09/13/20 1146

## 2020-09-13 NOTE — ED Triage Notes (Signed)
Pt c/o right eye irritation, states she wears contacts and right eye got irritated when she placed it in. Currently she is not wearing the right contact. Pt states she has itching to eye and it is swollen when she got up. She states that nothing got into her eye (debris). Vision is normal per patient.  Swelling started yesterday.   She states she did not taken any intervention.

## 2020-09-13 NOTE — Discharge Instructions (Addendum)
Take the Valtrex 3 times a day for the next 7 days.    Do not put in contacts for at least a week.   You can take Tylenol and/or Ibuprofen as needed for pain relief and fever reduction.    Follow up with your eye doctor as soon as possible for re-evaluation.  Return or go to the Emergency Department if symptoms worsen or do not improve in the next few days.

## 2021-01-17 ENCOUNTER — Other Ambulatory Visit (HOSPITAL_COMMUNITY)
Admission: RE | Admit: 2021-01-17 | Discharge: 2021-01-17 | Disposition: A | Discharge status: Home / Self Care | Payer: Medicaid Other | Source: Ambulatory Visit | Attending: Obstetrics and Gynecology | Admitting: Obstetrics and Gynecology

## 2021-01-17 ENCOUNTER — Encounter: Payer: Self-pay | Admitting: Obstetrics and Gynecology

## 2021-01-17 ENCOUNTER — Ambulatory Visit (INDEPENDENT_AMBULATORY_CARE_PROVIDER_SITE_OTHER): Payer: Medicaid Other | Admitting: Obstetrics and Gynecology

## 2021-01-17 ENCOUNTER — Other Ambulatory Visit: Payer: Self-pay

## 2021-01-17 DIAGNOSIS — Z3480 Encounter for supervision of other normal pregnancy, unspecified trimester: Secondary | ICD-10-CM

## 2021-01-17 DIAGNOSIS — Z3482 Encounter for supervision of other normal pregnancy, second trimester: Secondary | ICD-10-CM | POA: Diagnosis not present

## 2021-01-17 DIAGNOSIS — O09899 Supervision of other high risk pregnancies, unspecified trimester: Secondary | ICD-10-CM

## 2021-01-17 MED ORDER — VITAFOL GUMMIES 3.33-0.333-34.8 MG PO CHEW: 2.0000 | CHEWABLE_TABLET | Freq: Every day | ORAL | 6 refills | Status: DC

## 2021-01-17 MED ORDER — COMFORT FIT MATERNITY SUPP LG MISC: 0 refills | Status: DC

## 2021-01-17 NOTE — Addendum Note (Signed)
Addended by: Charlsie Quest B on: 01/17/2021 03:59 PM   Modules accepted: Orders

## 2021-01-17 NOTE — Progress Notes (Signed)
Pt presents for initial prenatal exam. PHQ 9: 0. Needs Rx for PNV.

## 2021-01-17 NOTE — Patient Instructions (Signed)

## 2021-01-17 NOTE — Progress Notes (Signed)
Subjective:    Kristen Grimes is a [id number] at [redacted]w[redacted]d being seen today for her first obstetrical visit.  Her obstetrical history is significant for  grand multiple and Gabor interval between pregnancy . Patient does intend to breast feed. Pregnancy history fully reviewed.  Patient reports no complaints.  Vitals:   01/17/21 1533  BP: 121/71  Pulse: 81  Weight: 170 lb (77.1 kg)    HISTORY: OB History  Gravida Para Term Preterm AB Living  5 4 4     4   SAB IAB Ectopic Multiple Live Births        0 4    # Outcome Date GA Lbr Len/2nd Weight Sex Delivery Anes PTL Lv  5 Current           4 Term 01/13/20 [redacted]w[redacted]d 03:52 / 00:08 7 lb 7 oz (3.374 kg) M Vag-Spont EPI  LIV  3 Term 07/25/18 [redacted]w[redacted]d 12:06 / 00:11 6 lb 13 oz (3.09 kg) F Vag-Spont None  LIV  2 Term 11/16/13 [redacted]w[redacted]d -21:07 / 00:01 6 lb 15.1 oz (3.15 kg) F Vag-Spont None  LIV  1 Term 07/31/10 [redacted]w[redacted]d   M Vag-Spont None N LIV   Past Medical History:  Diagnosis Date  . Anemia affecting pregnancy, antepartum 01/27/2018  . GBS (group B Streptococcus carrier), +RV culture, currently pregnant 07/25/2018  . Gonorrhea affecting pregnancy 07/25/2018  . Group B streptococcal carriage complicating pregnancy 07/25/2018  . Medical history non-contributory    Past Surgical History:  Procedure Laterality Date  . NO PAST SURGERIES     Family History  Problem Relation Age of Onset  . Multiple sclerosis Mother      Exam    Uterus:  Fundal Height: 16 cm  Pelvic Exam:    Perineum: No Hemorrhoids, Normal Perineum   Vulva: normal   Vagina:  normal mucosa, normal discharge   pH:    Cervix: multiparous appearance and cervix is closed and long   Adnexa: normal adnexa and no mass, fullness, tenderness   Bony Pelvis: gynecoid  System: Breast:  normal appearance, no masses or tenderness   Skin: normal coloration and turgor, no rashes    Neurologic: oriented, no focal deficits   Extremities: normal strength, tone, and muscle mass   HEENT extra  ocular movement intact   Mouth/Teeth mucous membranes moist, pharynx normal without lesions and dental hygiene good   Neck supple and no masses   Cardiovascular: regular rate and rhythm   Respiratory:  appears well, vitals normal, no respiratory distress, acyanotic, normal RR, chest clear, no wheezing, crepitations, rhonchi, normal symmetric air entry   Abdomen: soft, non-tender; bowel sounds normal; no masses,  no organomegaly   Urinary:    Pt informed that the ultrasound is considered a limited OB ultrasound and is not intended to be a complete ultrasound exam.  Patient also informed that the ultrasound is not being completed with the intent of assessing for fetal or placental anomalies or any pelvic abnormalities.  Explained that the purpose of today's ultrasound is to assess for  viability.  Patient acknowledges the purpose of the exam and the limitations of the study.  + fetal movement and heart rate visualized by myself, patient and her partner     Assessment:    Pregnancy: 09/24/2018 Patient Active Problem List   Diagnosis Date Noted  . Contraceptive patch status 02/13/2020  . Golaszewski interval between pregnancies affecting pregnancy, antepartum 07/13/2019  . Supervision of other normal pregnancy, antepartum 07/04/2019  . Seasonal rhinitis  06/18/2013        Plan:     Initial labs drawn. Prenatal vitamins. Problem list reviewed and updated. Genetic Screening discussed : panorama ordered.  Ultrasound discussed; fetal survey: ordered.  Follow up in 4 weeks. 50% of 30 min visit spent on counseling and coordination of care.     Jonise Weightman 01/17/2021

## 2021-01-18 LAB — CERVICOVAGINAL ANCILLARY ONLY
Chlamydia: NEGATIVE
Comment: NEGATIVE
Comment: NEGATIVE
Comment: NORMAL
Neisseria Gonorrhea: NEGATIVE
Trichomonas: NEGATIVE

## 2021-01-19 LAB — CBC/D/PLT+RPR+RH+ABO+RUBIGG...
Antibody Screen: NEGATIVE
Basophils Absolute: 0 10*3/uL (ref 0.0–0.2)
Basos: 0 %
EOS (ABSOLUTE): 0.1 10*3/uL (ref 0.0–0.4)
Eos: 1 %
HCV Ab: <0.1 s/co ratio (ref 0.0–0.9)
HIV Screen 4th Generation wRfx: NONREACTIVE
Hematocrit: 32.8 % — ABNORMAL LOW (ref 34.0–46.6)
Hemoglobin: 11.4 g/dL (ref 11.1–15.9)
Hepatitis B Surface Ag: NEGATIVE
Immature Grans (Abs): 0 10*3/uL (ref 0.0–0.1)
Immature Granulocytes: 0 %
Lymphocytes Absolute: 3 10*3/uL (ref 0.7–3.1)
Lymphs: 41 %
MCH: 32.1 pg (ref 26.6–33.0)
MCHC: 34.8 g/dL (ref 31.5–35.7)
MCV: 92 fL (ref 79–97)
Monocytes Absolute: 0.6 10*3/uL (ref 0.1–0.9)
Monocytes: 8 %
Neutrophils Absolute: 3.7 10*3/uL (ref 1.4–7.0)
Neutrophils: 50 %
Platelets: 284 10*3/uL (ref 150–450)
RBC: 3.55 x10E6/uL — ABNORMAL LOW (ref 3.77–5.28)
RDW: 14.5 % (ref 11.7–15.4)
RPR Ser Ql: NONREACTIVE
Rh Factor: POSITIVE
Rubella Antibodies, IGG: 2.95 index (ref >0.99)
WBC: 7.4 10*3/uL (ref 3.4–10.8)

## 2021-01-19 LAB — AFP, SERUM, OPEN SPINA BIFIDA
AFP MoM: 1.34
AFP Value: 42.9 ng/mL
Gest. Age on Collection Date: 15.2 weeks
Maternal Age At EDD: 28.4 yr
OSBR Risk 1 IN: 8546
Test Results:: NEGATIVE
Weight: 170 [lb_av]

## 2021-01-19 LAB — HCV INTERPRETATION

## 2021-01-21 LAB — CYTOLOGY - PAP
Adequacy: ABSENT
Diagnosis: NEGATIVE

## 2021-01-22 ENCOUNTER — Encounter: Payer: Self-pay | Admitting: *Deleted

## 2021-01-22 ENCOUNTER — Encounter: Payer: Self-pay | Admitting: Obstetrics

## 2021-01-22 ENCOUNTER — Other Ambulatory Visit: Payer: Self-pay | Admitting: *Deleted

## 2021-01-22 DIAGNOSIS — Z3480 Encounter for supervision of other normal pregnancy, unspecified trimester: Secondary | ICD-10-CM

## 2021-01-22 MED ORDER — VITAFOL GUMMIES 3.33-0.333-34.8 MG PO CHEW: 2.0000 | CHEWABLE_TABLET | Freq: Every day | ORAL | 6 refills | Status: DC

## 2021-01-24 ENCOUNTER — Other Ambulatory Visit: Payer: Self-pay | Admitting: *Deleted

## 2021-01-24 ENCOUNTER — Encounter: Payer: Self-pay | Admitting: Obstetrics and Gynecology

## 2021-01-24 DIAGNOSIS — Z3480 Encounter for supervision of other normal pregnancy, unspecified trimester: Secondary | ICD-10-CM

## 2021-01-24 MED ORDER — COMFORT FIT MATERNITY SUPP LG MISC: 0 refills | Status: DC

## 2021-01-24 MED ORDER — VITAFOL GUMMIES 3.33-0.333-34.8 MG PO CHEW: 2.0000 | CHEWABLE_TABLET | Freq: Every day | ORAL | 6 refills | Status: AC

## 2021-02-12 ENCOUNTER — Other Ambulatory Visit: Payer: Self-pay

## 2021-02-12 ENCOUNTER — Ambulatory Visit: Payer: Medicaid Other | Attending: Obstetrics and Gynecology

## 2021-02-12 ENCOUNTER — Other Ambulatory Visit: Payer: Self-pay | Admitting: Obstetrics and Gynecology

## 2021-02-12 DIAGNOSIS — O358XX0 Maternal care for other (suspected) fetal abnormality and damage, not applicable or unspecified: Secondary | ICD-10-CM | POA: Diagnosis not present

## 2021-02-12 DIAGNOSIS — Z3480 Encounter for supervision of other normal pregnancy, unspecified trimester: Secondary | ICD-10-CM

## 2021-02-12 DIAGNOSIS — Z363 Encounter for antenatal screening for malformations: Secondary | ICD-10-CM | POA: Insufficient documentation

## 2021-02-12 DIAGNOSIS — O09892 Supervision of other high risk pregnancies, second trimester: Secondary | ICD-10-CM | POA: Diagnosis not present

## 2021-02-12 DIAGNOSIS — Z3A19 19 weeks gestation of pregnancy: Secondary | ICD-10-CM | POA: Diagnosis not present

## 2021-02-13 ENCOUNTER — Other Ambulatory Visit: Payer: Self-pay | Admitting: *Deleted

## 2021-02-13 DIAGNOSIS — O09899 Supervision of other high risk pregnancies, unspecified trimester: Secondary | ICD-10-CM

## 2021-02-13 DIAGNOSIS — O283 Abnormal ultrasonic finding on antenatal screening of mother: Secondary | ICD-10-CM

## 2021-02-18 ENCOUNTER — Other Ambulatory Visit: Payer: Self-pay

## 2021-02-18 DIAGNOSIS — B379 Candidiasis, unspecified: Secondary | ICD-10-CM

## 2021-02-18 MED ORDER — TERCONAZOLE 0.4 % VA CREA: 1.0000 | TOPICAL_CREAM | Freq: Every day | VAGINAL | 0 refills | Status: DC

## 2021-02-20 ENCOUNTER — Encounter: Payer: Self-pay | Admitting: Obstetrics

## 2021-02-20 ENCOUNTER — Other Ambulatory Visit: Payer: Self-pay

## 2021-02-20 ENCOUNTER — Ambulatory Visit (INDEPENDENT_AMBULATORY_CARE_PROVIDER_SITE_OTHER): Payer: Medicaid Other | Admitting: Obstetrics

## 2021-02-20 VITALS — BP 111/67 | HR 90 | Wt 172.0 lb

## 2021-02-20 DIAGNOSIS — J0100 Acute maxillary sinusitis, unspecified: Secondary | ICD-10-CM

## 2021-02-20 DIAGNOSIS — Z3480 Encounter for supervision of other normal pregnancy, unspecified trimester: Secondary | ICD-10-CM

## 2021-02-20 MED ORDER — LORATADINE 10 MG PO TABS: 10.0000 mg | ORAL_TABLET | Freq: Every day | ORAL | 11 refills | Status: DC

## 2021-02-20 MED ORDER — AMOXICILLIN-POT CLAVULANATE 875-125 MG PO TABS: 1.0000 | ORAL_TABLET | Freq: Two times a day (BID) | ORAL | 0 refills | Status: DC

## 2021-02-20 NOTE — Progress Notes (Signed)
ROB 20 wks  Reports bilateral pain above eyes. Sinus pressure/ congestion. No relief with tylenol.

## 2021-02-20 NOTE — Progress Notes (Signed)
Subjective:  Kristen Grimes is a 30 y.o. G5P4004 at [redacted]w[redacted]d being seen today for ongoing prenatal care.  She is currently monitored for the following issues for this low-risk pregnancy and has Seasonal rhinitis; Supervision of other normal pregnancy, antepartum; Ruscitti interval between pregnancies affecting pregnancy, antepartum; and Contraceptive patch status on their problem list.  Patient reports headache and runny nose .  Contractions: Not present. Vag. Bleeding: None.   . Denies leaking of fluid.   The following portions of the patient's history were reviewed and updated as appropriate: allergies, current medications, past family history, past medical history, past social history, past surgical history and problem list. Problem list updated.  Objective:   Vitals:   02/20/21 1544  BP: 111/67  Pulse: 90  Weight: 172 lb (78 kg)    Fetal Status: Fetal Heart Rate (bpm): 154         General:  Alert, oriented and cooperative. Patient is in no acute distress.  Skin: Skin is warm and dry. No rash noted.   Cardiovascular: Normal heart rate noted  Respiratory: Normal respiratory effort, no problems with respiration noted  Abdomen: Soft, gravid, appropriate for gestational age. Pain/Pressure: Absent     Pelvic:  Cervical exam deferred        Extremities: Normal range of motion.  Edema: None  Mental Status: Normal mood and affect. Normal behavior. Normal judgment and thought content.   Urinalysis:      Assessment and Plan:  Pregnancy: G5P4004 at [redacted]w[redacted]d  1. Supervision of other normal pregnancy, antepartum  2. Acute maxillary sinusitis, recurrence not specified Rx: - amoxicillin-clavulanate (AUGMENTIN) 875-125 MG tablet; Take 1 tablet by mouth 2 (two) times daily.  Dispense: 14 tablet; Refill: 0 - loratadine (CLARITIN) 10 MG tablet; Take 1 tablet (10 mg total) by mouth daily.  Dispense: 30 tablet; Refill: 11    Preterm labor symptoms and general obstetric precautions including but not  limited to vaginal bleeding, contractions, leaking of fluid and fetal movement were reviewed in detail with the patient. Please refer to After Visit Summary for other counseling recommendations.   Return in about 4 weeks (around 03/20/2021) for ROB.   Brock Bad, MD  02/20/21

## 2021-03-12 ENCOUNTER — Other Ambulatory Visit: Payer: Self-pay

## 2021-03-12 ENCOUNTER — Ambulatory Visit: Payer: Medicaid Other | Attending: Obstetrics

## 2021-03-12 ENCOUNTER — Ambulatory Visit: Payer: Medicaid Other | Admitting: *Deleted

## 2021-03-12 ENCOUNTER — Encounter: Payer: Self-pay | Admitting: *Deleted

## 2021-03-12 VITALS — BP 107/55 | HR 84

## 2021-03-12 DIAGNOSIS — Z362 Encounter for other antenatal screening follow-up: Secondary | ICD-10-CM | POA: Insufficient documentation

## 2021-03-12 DIAGNOSIS — Z3A23 23 weeks gestation of pregnancy: Secondary | ICD-10-CM

## 2021-03-12 DIAGNOSIS — O35BXX0 Maternal care for other (suspected) fetal abnormality and damage, fetal cardiac anomalies, not applicable or unspecified: Secondary | ICD-10-CM

## 2021-03-12 DIAGNOSIS — O283 Abnormal ultrasonic finding on antenatal screening of mother: Secondary | ICD-10-CM | POA: Diagnosis present

## 2021-03-12 DIAGNOSIS — O09899 Supervision of other high risk pregnancies, unspecified trimester: Secondary | ICD-10-CM | POA: Diagnosis present

## 2021-03-17 NOTE — L&D Delivery Note (Signed)
Delivery Note ?At 5:24 PM a viable and healthy female was delivered via Vaginal, Spontaneous (Presentation: Right Occiput Anterior).  APGAR: 9, 9; weight  pending .   ?Placenta status: Spontaneous, Intact.  Cord: 3 vessels with the following complications: None.   ? ?Anesthesia: Epidural ?Episiotomy: None ?Lacerations: None ?Suture Repair:  n/a ?Est. Blood Loss (mL): 200 ? ?Mom to postpartum.  Baby to Couplet care / Skin to Skin. ? ?Kristen Grimes is a 31 y.o. female [id number] with IUP at [redacted]w[redacted]d admitted for IOL for vaginal bleeding at term.  She progressed with augmentation to complete and pushed with one contraction to deliver.  Cord clamping delayed by 1-3 minutes then clamped by CNM and cut by FOB.  Placenta intact and spontaneous, bleeding minimal.  No laceration/no repair. Mom and baby stable prior to transfer to postpartum. She plans on breastfeeding. She requests BTL for birth control.  ? ?[name] ?07/06/2021, 6:34 PM ? ? ? ?

## 2021-03-20 ENCOUNTER — Encounter: Payer: Medicaid Other | Admitting: Women's Health

## 2021-03-23 ENCOUNTER — Encounter: Payer: Self-pay | Admitting: Obstetrics

## 2021-04-02 ENCOUNTER — Encounter: Payer: Self-pay | Admitting: Obstetrics

## 2021-04-02 ENCOUNTER — Other Ambulatory Visit: Payer: Self-pay

## 2021-04-02 ENCOUNTER — Ambulatory Visit (INDEPENDENT_AMBULATORY_CARE_PROVIDER_SITE_OTHER): Payer: Medicaid Other | Admitting: Obstetrics

## 2021-04-02 VITALS — BP 110/62 | HR 87 | Wt 179.0 lb

## 2021-04-02 DIAGNOSIS — Z3A26 26 weeks gestation of pregnancy: Secondary | ICD-10-CM

## 2021-04-02 DIAGNOSIS — Z3482 Encounter for supervision of other normal pregnancy, second trimester: Secondary | ICD-10-CM

## 2021-04-02 DIAGNOSIS — Z3480 Encounter for supervision of other normal pregnancy, unspecified trimester: Secondary | ICD-10-CM

## 2021-04-02 NOTE — Progress Notes (Signed)
Subjective:  Kristen Grimes is a 31 y.o. G5P4004 at [redacted]w[redacted]d being seen today for ongoing prenatal care.  She is currently monitored for the following issues for this low-risk pregnancy and has Seasonal rhinitis; Supervision of other normal pregnancy, antepartum; Wiater interval between pregnancies affecting pregnancy, antepartum; and Contraceptive patch status on their problem list.  Patient reports no complaints.  Contractions: Not present. Vag. Bleeding: None.  Movement: Present. Denies leaking of fluid.   The following portions of the patient's history were reviewed and updated as appropriate: allergies, current medications, past family history, past medical history, past social history, past surgical history and problem list. Problem list updated.  Objective:   Vitals:   04/02/21 1134  BP: 110/62  Pulse: 87  Weight: 179 lb (81.2 kg)    Fetal Status:     Movement: Present     General:  Alert, oriented and cooperative. Patient is in no acute distress.  Skin: Skin is warm and dry. No rash noted.   Cardiovascular: Normal heart rate noted  Respiratory: Normal respiratory effort, no problems with respiration noted  Abdomen: Soft, gravid, appropriate for gestational age. Pain/Pressure: Absent     Pelvic:  Cervical exam deferred        Extremities: Normal range of motion.     Mental Status: Normal mood and affect. Normal behavior. Normal judgment and thought content.   Urinalysis:      Assessment and Plan:  Pregnancy: G5P4004 at [redacted]w[redacted]d  1. Supervision of other normal pregnancy, antepartum   Preterm labor symptoms and general obstetric precautions including but not limited to vaginal bleeding, contractions, leaking of fluid and fetal movement were reviewed in detail with the patient. Please refer to After Visit Summary for other counseling recommendations.   Return in about 2 weeks (around 04/16/2021) for ROB, 2 hour OGTT.   Brock Bad, MD  04/02/2021 11:54 AM

## 2021-04-16 ENCOUNTER — Encounter: Payer: Medicaid Other | Admitting: Advanced Practice Midwife

## 2021-04-16 ENCOUNTER — Other Ambulatory Visit: Payer: Medicaid Other

## 2021-04-19 ENCOUNTER — Ambulatory Visit (INDEPENDENT_AMBULATORY_CARE_PROVIDER_SITE_OTHER): Payer: Medicaid Other | Admitting: Obstetrics

## 2021-04-19 ENCOUNTER — Other Ambulatory Visit: Payer: Self-pay

## 2021-04-19 ENCOUNTER — Other Ambulatory Visit: Payer: Medicaid Other

## 2021-04-19 ENCOUNTER — Encounter: Payer: Self-pay | Admitting: Obstetrics

## 2021-04-19 VITALS — BP 116/62 | HR 78 | Wt 178.7 lb

## 2021-04-19 DIAGNOSIS — Z3480 Encounter for supervision of other normal pregnancy, unspecified trimester: Secondary | ICD-10-CM

## 2021-04-19 DIAGNOSIS — O09899 Supervision of other high risk pregnancies, unspecified trimester: Secondary | ICD-10-CM

## 2021-04-19 NOTE — Progress Notes (Signed)
Subjective:  Kristen Grimes is a 31 y.o. G5P4004 at [redacted]w[redacted]d being seen today for ongoing prenatal care.  She is currently monitored for the following issues for this low-risk pregnancy and has Seasonal rhinitis; Supervision of other normal pregnancy, antepartum; Ratti interval between pregnancies affecting pregnancy, antepartum; and Contraceptive patch status on their problem list.  Patient reports no complaints.   .  .   . Denies leaking of fluid.   The following portions of the patient's history were reviewed and updated as appropriate: allergies, current medications, past family history, past medical history, past social history, past surgical history and problem list. Problem list updated.  Objective:  There were no vitals filed for this visit.  Fetal Status:           General:  Alert, oriented and cooperative. Patient is in no acute distress.  Skin: Skin is warm and dry. No rash noted.   Cardiovascular: Normal heart rate noted  Respiratory: Normal respiratory effort, no problems with respiration noted  Abdomen: Soft, gravid, appropriate for gestational age.       Pelvic:  Cervical exam deferred        Extremities: Normal range of motion.     Mental Status: Normal mood and affect. Normal behavior. Normal judgment and thought content.   Urinalysis:      Assessment and Plan:  Pregnancy: G5P4004 at [redacted]w[redacted]d  1. Supervision of other normal pregnancy, antepartum Rx: - Glucose Tolerance, 2 Hours w/1 Hour - RPR - HIV antibody (with reflex) - CBC  2. Utley interval between pregnancies affecting pregnancy, antepartum   Preterm labor symptoms and general obstetric precautions including but not limited to vaginal bleeding, contractions, leaking of fluid and fetal movement were reviewed in detail with the patient. Please refer to After Visit Summary for other counseling recommendations.   Return in about 2 weeks (around 05/03/2021) for ROB.   Brock Bad, MD  04/19/21

## 2021-04-19 NOTE — Progress Notes (Signed)
Pt reports fetal movement, denies pain.  

## 2021-04-20 LAB — CBC
Hematocrit: 33.4 % — ABNORMAL LOW (ref 34.0–46.6)
Hemoglobin: 11.1 g/dL (ref 11.1–15.9)
MCH: 30.7 pg (ref 26.6–33.0)
MCHC: 33.2 g/dL (ref 31.5–35.7)
MCV: 93 fL (ref 79–97)
Platelets: 248 10*3/uL (ref 150–450)
RBC: 3.61 x10E6/uL — ABNORMAL LOW (ref 3.77–5.28)
RDW: 12.3 % (ref 11.7–15.4)
WBC: 5.1 10*3/uL (ref 3.4–10.8)

## 2021-04-20 LAB — RPR: RPR Ser Ql: NONREACTIVE

## 2021-04-20 LAB — GLUCOSE TOLERANCE, 2 HOURS W/ 1HR
Glucose, 1 hour: 73 mg/dL (ref 70–179)
Glucose, 2 hour: 78 mg/dL (ref 70–152)
Glucose, Fasting: 75 mg/dL (ref 70–91)

## 2021-04-20 LAB — HIV ANTIBODY (ROUTINE TESTING W REFLEX): HIV Screen 4th Generation wRfx: NONREACTIVE

## 2021-04-30 ENCOUNTER — Encounter: Payer: Self-pay | Admitting: Obstetrics

## 2021-04-30 ENCOUNTER — Ambulatory Visit (INDEPENDENT_AMBULATORY_CARE_PROVIDER_SITE_OTHER): Payer: Medicaid Other | Admitting: Obstetrics

## 2021-04-30 ENCOUNTER — Other Ambulatory Visit (HOSPITAL_COMMUNITY)
Admission: RE | Admit: 2021-04-30 | Discharge: 2021-04-30 | Disposition: A | Discharge status: Home / Self Care | Payer: Medicaid Other | Source: Ambulatory Visit | Attending: Obstetrics | Admitting: Obstetrics

## 2021-04-30 ENCOUNTER — Other Ambulatory Visit: Payer: Self-pay

## 2021-04-30 VITALS — BP 115/69 | HR 89 | Wt 186.0 lb

## 2021-04-30 DIAGNOSIS — O09899 Supervision of other high risk pregnancies, unspecified trimester: Secondary | ICD-10-CM

## 2021-04-30 DIAGNOSIS — Z3480 Encounter for supervision of other normal pregnancy, unspecified trimester: Secondary | ICD-10-CM | POA: Diagnosis not present

## 2021-04-30 DIAGNOSIS — O26893 Other specified pregnancy related conditions, third trimester: Secondary | ICD-10-CM | POA: Insufficient documentation

## 2021-04-30 DIAGNOSIS — N898 Other specified noninflammatory disorders of vagina: Secondary | ICD-10-CM | POA: Diagnosis present

## 2021-04-30 MED ORDER — VITAFOL-NANO 18-0.6-0.4 MG PO TABS: 1.0000 | ORAL_TABLET | Freq: Every day | ORAL | 4 refills | Status: DC

## 2021-04-30 NOTE — Progress Notes (Signed)
Subjective:  Kristen Grimes is a 31 y.o. G5P4004 at [redacted]w[redacted]d being seen today for ongoing prenatal care.  She is currently monitored for the following issues for this low-risk pregnancy and has Seasonal rhinitis; Supervision of other normal pregnancy, antepartum; Hammar interval between pregnancies affecting pregnancy, antepartum; and Contraceptive patch status on their problem list.  Patient reports vaginal irritation and vaginal discharge .  Contractions: Not present. Vag. Bleeding: None.  Movement: Present. Denies leaking of fluid.   The following portions of the patient's history were reviewed and updated as appropriate: allergies, current medications, past family history, past medical history, past social history, past surgical history and problem list. Problem list updated.  Objective:   Vitals:   04/30/21 1034  BP: 115/69  Pulse: 89  Weight: 186 lb (84.4 kg)    Fetal Status:     Movement: Present     General:  Alert, oriented and cooperative. Patient is in no acute distress.  Skin: Skin is warm and dry. No rash noted.   Cardiovascular: Normal heart rate noted  Respiratory: Normal respiratory effort, no problems with respiration noted  Abdomen: Soft, gravid, appropriate for gestational age. Pain/Pressure: Absent     Pelvic:  Cervical exam deferred        Extremities: Normal range of motion.  Edema: None  Mental Status: Normal mood and affect. Normal behavior. Normal judgment and thought content.   Urinalysis:      Assessment and Plan:  Pregnancy: G5P4004 at [redacted]w[redacted]d  1. Supervision of other normal pregnancy, antepartum  Rx: - Prenatal-Fe Fum-Methf-FA w/o A (VITAFOL-NANO) 18-0.6-0.4 MG TABS; Take 1 tablet by mouth daily.  Dispense: 90 tablet; Refill: 4 - Urine Culture  2. Vetsch interval between pregnancies affecting pregnancy, antepartum  3. Vaginal discharge during pregnancy in third trimester Rx: - Cervicovaginal ancillary only( [facility])  Preterm labor symptoms and  general obstetric precautions including but not limited to vaginal bleeding, contractions, leaking of fluid and fetal movement were reviewed in detail with the patient. Please refer to After Visit Summary for other counseling recommendations.   Return in about 2 weeks (around 05/14/2021) for ROB.   Shelly Bombard, MD  04/30/21

## 2021-04-30 NOTE — Progress Notes (Signed)
ROB 30.0 wks Reports white vag dc No other unusual concerns

## 2021-05-01 LAB — CERVICOVAGINAL ANCILLARY ONLY
Bacterial Vaginitis (gardnerella): NEGATIVE
Candida Glabrata: NEGATIVE
Candida Vaginitis: NEGATIVE
Chlamydia: NEGATIVE
Comment: NEGATIVE
Comment: NEGATIVE
Comment: NEGATIVE
Comment: NEGATIVE
Comment: NEGATIVE
Comment: NORMAL
Neisseria Gonorrhea: NEGATIVE
Trichomonas: NEGATIVE

## 2021-05-03 ENCOUNTER — Encounter: Payer: Self-pay | Admitting: Obstetrics

## 2021-05-03 ENCOUNTER — Other Ambulatory Visit: Payer: Self-pay | Admitting: Obstetrics

## 2021-05-03 DIAGNOSIS — J0100 Acute maxillary sinusitis, unspecified: Secondary | ICD-10-CM

## 2021-05-03 LAB — URINE CULTURE

## 2021-05-03 MED ORDER — AMOXICILLIN-POT CLAVULANATE 875-125 MG PO TABS: 1.0000 | ORAL_TABLET | Freq: Two times a day (BID) | ORAL | 0 refills | Status: DC

## 2021-05-14 ENCOUNTER — Other Ambulatory Visit: Payer: Self-pay

## 2021-05-14 ENCOUNTER — Ambulatory Visit (INDEPENDENT_AMBULATORY_CARE_PROVIDER_SITE_OTHER): Payer: Medicaid Other | Admitting: Obstetrics & Gynecology

## 2021-05-14 VITALS — BP 112/71 | HR 92 | Wt 184.0 lb

## 2021-05-14 DIAGNOSIS — Z3480 Encounter for supervision of other normal pregnancy, unspecified trimester: Secondary | ICD-10-CM

## 2021-05-14 DIAGNOSIS — O09899 Supervision of other high risk pregnancies, unspecified trimester: Secondary | ICD-10-CM

## 2021-05-14 DIAGNOSIS — O99820 Streptococcus B carrier state complicating pregnancy: Secondary | ICD-10-CM

## 2021-05-14 DIAGNOSIS — O09893 Supervision of other high risk pregnancies, third trimester: Secondary | ICD-10-CM

## 2021-05-14 DIAGNOSIS — O2343 Unspecified infection of urinary tract in pregnancy, third trimester: Secondary | ICD-10-CM

## 2021-05-14 DIAGNOSIS — Z3A32 32 weeks gestation of pregnancy: Secondary | ICD-10-CM

## 2021-05-14 DIAGNOSIS — J0100 Acute maxillary sinusitis, unspecified: Secondary | ICD-10-CM

## 2021-05-14 NOTE — Progress Notes (Signed)
Pt has rash to hands, itching x 1-2 weeks.

## 2021-05-14 NOTE — Progress Notes (Signed)
° °  PRENATAL VISIT NOTE  Subjective:  Kristen Grimes is a 31 y.o. G5P4004 at [redacted]w[redacted]d being seen today for ongoing prenatal care.  She is currently monitored for the following issues for this low-risk pregnancy and has Supervision of other normal pregnancy, antepartum and [place] interval between pregnancies affecting pregnancy, antepartum on their problem list.  Patient reports itching rash both volar hand surfaces.  Contractions: Not present. Vag. Bleeding: None.  Movement: Present. Denies leaking of fluid.   The following portions of the patient's history were reviewed and updated as appropriate: allergies, current medications, past family history, past medical history, past social history, past surgical history and problem list.   Objective:   Vitals:   05/14/21 1045  BP: 112/71  Pulse: 92  Weight: 184 lb (83.5 kg)    Fetal Status: Fetal Heart Rate (bpm): 140   Movement: Present     General:  Alert, oriented and cooperative. Patient is in no acute distress.  Skin: Skin is warm and dry. No rash noted.   Cardiovascular: Normal heart rate noted  Respiratory: Normal respiratory effort, no problems with respiration noted  Abdomen: Soft, gravid, appropriate for gestational age.  Pain/Pressure: Absent     Pelvic: Cervical exam deferred        Extremities: Normal range of motion.     Mental Status: Normal mood and affect. Normal behavior. Normal judgment and thought content.   Assessment and Plan:  Pregnancy: G5P4004 at [redacted]w[redacted]d 1. Supervision of other normal pregnancy, antepartum F/u for UTI. R/o cholestasis but otc 1% cortisone cream on her hands is ok - Culture, OB Urine - Bile acids, total  2. [place] interval between pregnancies affecting pregnancy, antepartum Will need to confirm presentation at 36 weeks  Preterm labor symptoms and general obstetric precautions including but not limited to vaginal bleeding, contractions, leaking of fluid and fetal movement were reviewed in detail with  the patient. Please refer to After Visit Summary for other counseling recommendations.   Return in about 2 weeks (around 05/28/2021).  No future appointments.  Scheryl Darter, MD

## 2021-05-16 LAB — BILE ACIDS, TOTAL: Bile Acids Total: 3.5 umol/L (ref 0.0–10.0)

## 2021-05-17 ENCOUNTER — Encounter: Payer: Self-pay | Admitting: Obstetrics & Gynecology

## 2021-05-18 LAB — CULTURE, OB URINE

## 2021-05-18 LAB — URINE CULTURE, OB REFLEX

## 2021-05-20 ENCOUNTER — Other Ambulatory Visit: Payer: Self-pay

## 2021-05-20 MED ORDER — CITRANATAL BLOOM 90-1 MG PO TABS: 1.0000 | ORAL_TABLET | Freq: Every day | ORAL | 12 refills | Status: DC

## 2021-05-23 DIAGNOSIS — B951 Streptococcus, group B, as the cause of diseases classified elsewhere: Secondary | ICD-10-CM | POA: Insufficient documentation

## 2021-05-23 MED ORDER — AMOXICILLIN-POT CLAVULANATE 875-125 MG PO TABS: 1.0000 | ORAL_TABLET | Freq: Two times a day (BID) | ORAL | 0 refills | Status: DC

## 2021-05-23 NOTE — Addendum Note (Signed)
Addended by: Adam Phenix on: 05/23/2021 09:44 AM   Modules accepted: Orders

## 2021-05-28 ENCOUNTER — Ambulatory Visit (INDEPENDENT_AMBULATORY_CARE_PROVIDER_SITE_OTHER): Payer: Medicaid Other | Admitting: Obstetrics & Gynecology

## 2021-05-28 ENCOUNTER — Other Ambulatory Visit: Payer: Self-pay

## 2021-05-28 VITALS — BP 118/71 | HR 99 | Wt 186.6 lb

## 2021-05-28 DIAGNOSIS — Z3480 Encounter for supervision of other normal pregnancy, unspecified trimester: Secondary | ICD-10-CM

## 2021-05-28 DIAGNOSIS — O09899 Supervision of other high risk pregnancies, unspecified trimester: Secondary | ICD-10-CM

## 2021-05-28 DIAGNOSIS — O99713 Diseases of the skin and subcutaneous tissue complicating pregnancy, third trimester: Secondary | ICD-10-CM

## 2021-05-28 DIAGNOSIS — L811 Chloasma: Secondary | ICD-10-CM

## 2021-05-28 MED ORDER — VITAFOL ULTRA 29-0.6-0.4-200 MG PO CAPS: 1.0000 | ORAL_CAPSULE | Freq: Every day | ORAL | 11 refills | Status: DC

## 2021-05-28 MED ORDER — PANTOPRAZOLE SODIUM 40 MG PO TBEC: 40.0000 mg | DELAYED_RELEASE_TABLET | Freq: Every day | ORAL | 1 refills | Status: DC

## 2021-05-28 MED ORDER — CITRANATAL DHA 27-1 & 250 MG PO MISC: 1.0000 | Freq: Every day | ORAL | 11 refills | Status: DC

## 2021-05-28 NOTE — Addendum Note (Signed)
Addended by: Hamilton Capri on: 05/28/2021 04:12 PM ? ? Modules accepted: Orders ? ?

## 2021-05-28 NOTE — Progress Notes (Signed)
? ?  PRENATAL VISIT NOTE ? ?Subjective:  ?Kristen Grimes is a 31 y.o. G5P4004 at [redacted]w[redacted]d being seen today for ongoing prenatal care.  She is currently monitored for the following issues for this low-risk pregnancy and has Supervision of other normal pregnancy, antepartum; [place] interval between pregnancies affecting pregnancy, antepartum; and Group B streptococcus urinary tract infection affecting pregnancy on their problem list. ? ?Patient reports heartburn and rash on hands and face .  Contractions: Irritability. Vag. Bleeding: None.  Movement: Present. Denies leaking of fluid.  ? ?The following portions of the patient's history were reviewed and updated as appropriate: allergies, current medications, past family history, past medical history, past social history, past surgical history and problem list.  ? ?Objective:  ? ?Vitals:  ? 05/28/21 1042  ?BP: 118/71  ?Pulse: 99  ?Weight: 186 lb 9.6 oz (84.6 kg)  ? ? ?Fetal Status: Fetal Heart Rate (bpm): 150   Movement: Present    ? ?General:  Alert, oriented and cooperative. Patient is in no acute distress.  ?Skin: Skin is warm and dry. No rash noted on hands, melasma on face  ?Cardiovascular: Normal heart rate noted  ?Respiratory: Normal respiratory effort, no problems with respiration noted  ?Abdomen: Soft, gravid, appropriate for gestational age.  Pain/Pressure: Absent     ?Pelvic: Cervical exam deferred        ?Extremities: Normal range of motion.  Edema: None  ?Mental Status: Normal mood and affect. Normal behavior. Normal judgment and thought content.  ? ?Assessment and Plan:  ?Pregnancy: [id number] at [redacted]w[redacted]d ?1. Supervision of other normal pregnancy, antepartum ? ?- Prenat w/o A-FeCbGl-DSS-FA-DHA (CITRANATAL DHA) 27-1 & 250 MG tablet; Take 1 tablet by mouth daily.  Dispense: 30 each; Refill: 11 ?- pantoprazole (PROTONIX) 40 MG tablet; Take 1 tablet (40 mg total) by mouth daily.  Dispense: 30 tablet; Refill: 1 ? ?2. [place] interval between pregnancies affecting pregnancy,  antepartum ? ? ?3. 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 ?Preterm labor symptoms and general obstetric precautions including but not limited to vaginal bleeding, contractions, leaking of fluid and fetal movement were reviewed in detail with the patient. ?Please refer to After Visit Summary for other counseling recommendations.  ? ?Return in about 2 weeks (around 06/11/2021). ? ?Future Appointments  ?Date Time Provider Department Center  ?06/11/2021 10:35 AM Leftwich-Kirby, Wilmer Floor, CNM [facility] None  ? ? ?Scheryl Darter, MD ? ?

## 2021-05-28 NOTE — Progress Notes (Signed)
Patient presents for ROB. Patient states that she has a rash on both hands and on her face. She states that sx started about 3 days ago. Skin is peeling on her face. She denies using anything new. Also states that her hands have been burning. Patient also complains of having really bad acid reflux and burning in her upper abdomen. ?

## 2021-05-29 ENCOUNTER — Encounter: Payer: Self-pay | Admitting: Advanced Practice Midwife

## 2021-05-29 ENCOUNTER — Telehealth: Payer: Self-pay | Admitting: *Deleted

## 2021-05-29 ENCOUNTER — Encounter (HOSPITAL_COMMUNITY): Payer: Self-pay | Admitting: Emergency Medicine

## 2021-05-29 ENCOUNTER — Other Ambulatory Visit: Payer: Self-pay

## 2021-05-29 ENCOUNTER — Ambulatory Visit (HOSPITAL_COMMUNITY)
Admission: EM | Admit: 2021-05-29 | Discharge: 2021-05-29 | Disposition: A | Discharge status: Home / Self Care | Payer: Medicaid Other | Attending: Family Medicine | Admitting: Family Medicine

## 2021-05-29 DIAGNOSIS — R21 Rash and other nonspecific skin eruption: Secondary | ICD-10-CM | POA: Diagnosis not present

## 2021-05-29 MED ORDER — HYDROCORTISONE 2.5 % EX OINT: TOPICAL_OINTMENT | Freq: Two times a day (BID) | CUTANEOUS | 0 refills | Status: DC

## 2021-05-29 NOTE — ED Triage Notes (Signed)
Patient has neck pain/burning ion each side of neck.  Neck started hurting last night.   ?Pain is making it difficult to turn neck.   ? ?Saw a doctor about rash to face yesterday.  Rash is spreading.  Patient also reports a "strep B uti " and started generic augmentin ?

## 2021-05-29 NOTE — Discharge Instructions (Addendum)
As we discussed, I am unsure what is causing your rash, but it could just be irritation.  This can be treated with steroid ointment.  Put this on twice daily.  Do not use for more than 2 weeks at a time.  Continue to speak with your OB about this.  If you experience very intense itching all of your body, you should let your OB know as soon as possible.  If you are not improving, please follow-up with your OB provider. ?

## 2021-05-29 NOTE — Telephone Encounter (Signed)
Call placed to pt regarding rash and itching she has been having.  ?Pt was seen at urgent care today- Hydrocortisone recommended. ?Per Dr Debroah Loop- pt should come back into office for further eval. Pt has been added to Dr Debroah Loop schedule this Friday.  ?Pt advised to use cream as directed until that time.  ? ?

## 2021-05-29 NOTE — ED Provider Notes (Signed)
?[facility] ? ? ? ?CSN: [id number] ?Arrival date & time: 05/29/21  [id number] ? ? ?  ? ?History   ?Chief Complaint ?Chief Complaint  ?Patient presents with  ? Neck Pain  ? ? ?HPI ?Kristen Grimes is a 31 y.o. female.  ? ?Neck Rash ?Noted suddenly this morning ?Is on both sides of her neck ?States that it is itchy ?Has not tried anything on it ?She was seen by her OB yesterday and had reported a rash that had been on her forearms and hands at that time ?That has since improved, but she was advised to use hydrocortisone ointment if it came back ?She denies intense generalized pruritus ?She is otherwise been feeling well ?Baby has been moving well for her, she denies regular contractions, leakage of fluid, vaginal bleeding ?She did reach out to her OB today in regards to this, but is not yet heard back from the provider ? ?Currently [redacted]w[redacted]d pregnant ?Diagnosed with a group B strep UTI and has been taking Augmentin for this for 5 days ? ? ?Past Medical History:  ?Diagnosis Date  ? Anemia affecting pregnancy, antepartum 01/27/2018  ? GBS (group B Streptococcus carrier), +RV culture, currently pregnant 07/25/2018  ? Gonorrhea affecting pregnancy 07/25/2018  ? Group B streptococcal carriage complicating pregnancy [id number]  ? Medical history non-contributory   ? ? ?Patient Active Problem List  ? Diagnosis Date Noted  ? Group B streptococcus urinary tract infection affecting pregnancy 05/23/2021  ? [place] interval between pregnancies affecting pregnancy, antepartum 07/13/2019  ? Supervision of other normal pregnancy, antepartum 07/04/2019  ? ? ?Past Surgical History:  ?Procedure Laterality Date  ? NO PAST SURGERIES    ? ? ?OB History   ? ? Gravida  ?5  ? Para  ?4  ? Term  ?4  ? Preterm  ?   ? AB  ?   ? Living  ?4  ?  ? ? SAB  ?   ? IAB  ?   ? Ectopic  ?   ? Multiple  ?0  ? Live Births  ?4  ?   ?  ?  ? ? ? ?Home Medications   ? ?Prior to Admission medications   ?Medication Sig Start Date End Date Taking? Authorizing Provider   ?hydrocortisone 2.5 % ointment Apply topically 2 (two) times daily. 05/29/21  Yes Dalexa Gentz, Bernita Raisin, DO  ?amoxicillin-clavulanate (AUGMENTIN) 875-125 MG tablet Take 1 tablet by mouth 2 (two) times daily. 05/23/21   Woodroe Mode, MD  ?pantoprazole (PROTONIX) 40 MG tablet Take 1 tablet (40 mg total) by mouth daily. ?Patient not taking: Reported on 05/29/2021 05/28/21 05/28/22  Woodroe Mode, MD  ?Prenat w/o A-FeCbGl-DSS-FA-DHA (CITRANATAL DHA) 27-1 & 250 MG tablet Take 1 tablet by mouth daily. 05/28/21   Woodroe Mode, MD  ?Prenat-Fe Poly-Methfol-FA-DHA (VITAFOL ULTRA) 29-0.6-0.4-200 MG CAPS Take 1 capsule by mouth daily. 05/28/21   Woodroe Mode, MD  ? ? ?Family History ?Family History  ?Problem Relation Age of Onset  ? Multiple sclerosis Mother   ? ? ?Social History ?Social History  ? ?Tobacco Use  ? Smoking status: Never  ? Smokeless tobacco: Never  ?Vaping Use  ? Vaping Use: Never used  ?Substance Use Topics  ? Alcohol use: Not Currently  ?  Comment: occ wine- stop with pregnancy  ? Drug use: No  ? ? ? ?Allergies   ?Patient has no known allergies. ? ? ?Review of Systems ?Review of Systems  ?All other systems  reviewed and are negative. ?Per HPI ? ?Physical Exam ?Triage Vital Signs ?ED Triage Vitals [05/29/21 [id number]  ?Enc Vitals Group  ?   BP   ?   Pulse   ?   Resp   ?   Temp   ?   Temp src   ?   SpO2   ?   Weight   ?   Height   ?   Head Circumference   ?   Peak Flow   ?   Pain Score 8  ?   Pain Loc   ?   Pain Edu?   ?   Excl. in [place]?   ? ?No data found. ? ?Updated Vital Signs ?BP 106/66 (BP Location: Right Arm)   Pulse 90   Temp 98 ?F (36.7 ?C) (Oral)   Resp 18   LMP 10/08/2020 (Exact Date)   SpO2 98%  ? ?Visual Acuity ?Right Eye Distance:   ?Left Eye Distance:   ?Bilateral Distance:   ? ?Right Eye Near:   ?Left Eye Near:    ?Bilateral Near:    ? ?Physical Exam ?Constitutional:   ?   General: She is not in acute distress. ?   Appearance: Normal appearance. She is not ill-appearing.  ?HENT:  ?   Head:  Normocephalic and atraumatic.  ?Eyes:  ?   Conjunctiva/sclera: Conjunctivae normal.  ?Cardiovascular:  ?   Rate and Rhythm: Normal rate.  ?Pulmonary:  ?   Effort: Pulmonary effort is normal. No respiratory distress.  ?Musculoskeletal:     ?   General: No swelling.  ?   Cervical back: Normal range of motion.  ?Skin: ?   General: Skin is warm and dry.  ?   Comments: Mildly erythematous plaque with very small pustules throughout on the bilateral lateral neck folds, see images below ?There are few scattered mildly erythematous plaques on upper arms ?Specifically no lesions noted on the abdomen  ?Neurological:  ?   Mental Status: She is alert and oriented to person, place, and time.  ?Psychiatric:     ?   Mood and Affect: Mood normal.     ?   Behavior: Behavior normal.  ? ? ? ?UC Treatments / Results  ?Labs ?(all labs ordered are listed, but only abnormal results are displayed) ?Labs Reviewed - No data to display ? ?EKG ? ? ?Radiology ?No results found. ? ?Procedures ?Procedures (including critical care time) ? ?Medications Ordered in UC ?Medications - No data to display ? ?Initial Impression / Assessment and Plan / UC Course  ?I have reviewed the triage vital signs and the nursing notes. ? ?Pertinent labs & imaging results that were available during my care of the patient were reviewed by me and considered in my medical decision making (see chart for details). ? ?  ? ?Unclear etiology of rash.  She denies any recent exposures to anyone with rashes and does not appear to be a specific dermatomal distribution to suggest shingles.  Given the distribution, could consider an atopic dermatitis given the appearance and location.  We will treat with hydrocortisone ointment twice daily, advised to not use more than 2 weeks at a time.  She denies any generalized pruritus, but did give her return precautions for this given concern for cholestasis of pregnancy if this occurs.  Recommend continue to follow-up with her OB regularly.   Did discuss with her that this does not seem to be related to the Augmentin as it is not a normal  appearance of a drug eruption. ? ? ?Final Clinical Impressions(s) / UC Diagnoses  ? ?Final diagnoses:  ?Rash and nonspecific skin eruption  ? ? ? ?Discharge Instructions   ? ?  ?As we discussed, I am unsure what is causing your rash, but it could just be irritation.  This can be treated with steroid ointment.  Put this on twice daily.  Do not use for more than 2 weeks at a time.  Continue to speak with your OB about this.  If you experience very intense itching all of your body, you should let your OB know as soon as possible.  If you are not improving, please follow-up with your OB provider. ? ? ? ? ?ED Prescriptions   ? ? Medication Sig Dispense Auth. Provider  ? hydrocortisone 2.5 % ointment Apply topically 2 (two) times daily. 30 g Karlea Mckibbin, Bernita Raisin, DO  ? ?  ? ?PDMP not reviewed this encounter. ?  ?Cleophas Dunker, DO ?05/29/21 1124 ? ?

## 2021-05-31 ENCOUNTER — Ambulatory Visit (INDEPENDENT_AMBULATORY_CARE_PROVIDER_SITE_OTHER): Payer: Medicaid Other | Admitting: Obstetrics & Gynecology

## 2021-05-31 ENCOUNTER — Other Ambulatory Visit: Payer: Self-pay

## 2021-05-31 VITALS — BP 119/74 | HR 90 | Wt 189.3 lb

## 2021-05-31 DIAGNOSIS — Z3480 Encounter for supervision of other normal pregnancy, unspecified trimester: Secondary | ICD-10-CM

## 2021-05-31 DIAGNOSIS — O2686 Pruritic urticarial papules and plaques of pregnancy (PUPPP): Secondary | ICD-10-CM

## 2021-05-31 NOTE — Progress Notes (Signed)
? ?  PRENATAL VISIT NOTE ? ?Subjective:  ?Kristen Grimes is a 31 y.o. G5P4004 at [redacted]w[redacted]d being seen today for ongoing prenatal care.  She is currently monitored for the following issues for this low-risk pregnancy and has Supervision of other normal pregnancy, antepartum; [place] interval between pregnancies affecting pregnancy, antepartum; and Group B streptococcus urinary tract infection affecting pregnancy on their problem list. ? ?Patient reports  several areas of puritic rash, responds to hydrocortisone topical .  Contractions: Not present. Vag. Bleeding: None.  Movement: Present. Denies leaking of fluid.  ? ?The following portions of the patient's history were reviewed and updated as appropriate: allergies, current medications, past family history, past medical history, past social history, past surgical history and problem list.  ? ?Objective:  ? ?Vitals:  ? 05/31/21 0940  ?BP: 119/74  ?Pulse: 90  ?Weight: 189 lb 4.8 oz (85.9 kg)  ? ? ?Fetal Status: Fetal Heart Rate (bpm): 140   Movement: Present    ? ?General:  Alert, oriented and cooperative. Patient is in no acute distress.  ?Skin: Skin is warm and dry. No rash noted.   ?Cardiovascular: Normal heart rate noted  ?Respiratory: Normal respiratory effort, no problems with respiration noted  ?Abdomen: Soft, gravid, appropriate for gestational age.  Pain/Pressure: Absent     ?Pelvic: Cervical exam deferred        ?Extremities: Normal range of motion.  Edema: None  ?Mental Status: Normal mood and affect. Normal behavior. Normal judgment and thought content.  ? ?Assessment and Plan:  ?Pregnancy: G8T1572 at [redacted]w[redacted]d ?1. Supervision of other normal pregnancy, antepartum ?Rash is dry; noted on arms abdomen and trunk, neck ? ?2. PUPPP (pruritic urticarial papules and plaques of pregnancy) ?Benign pregnancy rash. Benadyl, hydrocortisone topical and I offered prednisone but she will wait until f/u to decide ? ?Preterm labor symptoms and general obstetric precautions including  but not limited to vaginal bleeding, contractions, leaking of fluid and fetal movement were reviewed in detail with the patient. ?Please refer to After Visit Summary for other counseling recommendations.  ? ?Return in about 5 days (around 06/05/2021). ? ?Future Appointments  ?Date Time Provider Department Center  ?06/11/2021 10:35 AM Leftwich-Kirby, Wilmer Floor, CNM [facility] None  ? ? ?Scheryl Darter, MD ?

## 2021-06-02 ENCOUNTER — Other Ambulatory Visit: Payer: Self-pay | Admitting: Family Medicine

## 2021-06-11 ENCOUNTER — Ambulatory Visit (INDEPENDENT_AMBULATORY_CARE_PROVIDER_SITE_OTHER): Payer: Medicaid Other | Admitting: Advanced Practice Midwife

## 2021-06-11 ENCOUNTER — Encounter: Payer: Medicaid Other | Admitting: Obstetrics & Gynecology

## 2021-06-11 ENCOUNTER — Encounter: Payer: Self-pay | Admitting: Advanced Practice Midwife

## 2021-06-11 ENCOUNTER — Other Ambulatory Visit (HOSPITAL_COMMUNITY)
Admission: RE | Admit: 2021-06-11 | Discharge: 2021-06-11 | Disposition: A | Discharge status: Home / Self Care | Payer: Medicaid Other | Source: Ambulatory Visit | Attending: Advanced Practice Midwife | Admitting: Advanced Practice Midwife

## 2021-06-11 ENCOUNTER — Other Ambulatory Visit: Payer: Self-pay

## 2021-06-11 VITALS — BP 113/65 | HR 78 | Wt 189.6 lb

## 2021-06-11 DIAGNOSIS — Z3480 Encounter for supervision of other normal pregnancy, unspecified trimester: Secondary | ICD-10-CM | POA: Insufficient documentation

## 2021-06-11 DIAGNOSIS — O2343 Unspecified infection of urinary tract in pregnancy, third trimester: Secondary | ICD-10-CM | POA: Diagnosis not present

## 2021-06-11 DIAGNOSIS — Z3A36 36 weeks gestation of pregnancy: Secondary | ICD-10-CM | POA: Diagnosis not present

## 2021-06-11 NOTE — Addendum Note (Signed)
Addended by: Jearld Adjutant on: 06/11/2021 11:36 AM ? ? Modules accepted: Orders ? ?

## 2021-06-11 NOTE — Progress Notes (Signed)
? ?  PRENATAL VISIT NOTE ? ?Subjective:  ?Kristen Grimes is a 31 y.o. G5P4004 at [redacted]w[redacted]d being seen today for ongoing prenatal care.  She is currently monitored for the following issues for this low-risk pregnancy and has Supervision of other normal pregnancy, antepartum; [place] interval between pregnancies affecting pregnancy, antepartum; and Group B streptococcus urinary tract infection affecting pregnancy on their problem list. ? ?Patient reports occasional contractions and concerns about baby in breech position .  Contractions: Irritability. Vag. Bleeding: None.  Movement: Present. Denies leaking of fluid.  ? ?The following portions of the patient's history were reviewed and updated as appropriate: allergies, current medications, past family history, past medical history, past social history, past surgical history and problem list.  ? ?Objective:  ? ?Vitals:  ? 06/11/21 1041  ?BP: 113/65  ?Pulse: 78  ?Weight: 189 lb 9.6 oz (86 kg)  ? ? ?Fetal Status:     Movement: Present  Presentation: Vertex ? ?General:  Alert, oriented and cooperative. Patient is in no acute distress.  ?Skin: Skin is warm and dry. No rash noted.   ?Cardiovascular: Normal heart rate noted  ?Respiratory: Normal respiratory effort, no problems with respiration noted  ?Abdomen: Soft, gravid, appropriate for gestational age.  Pain/Pressure: Absent     ?Pelvic: Cervical exam performed in the presence of a chaperone Dilation: 1 Effacement (%): Thick Station: -2  ?Extremities: Normal range of motion.  Edema: None  ?Mental Status: Normal mood and affect. Normal behavior. Normal judgment and thought content.  ? ?Assessment and Plan:  ?Pregnancy: [id number] at [redacted]w[redacted]d ?1. UTI (urinary tract infection) during pregnancy, third trimester ?--TOC today ? ?2. [redacted] weeks gestation of pregnancy ? ? ?3. Supervision of other normal pregnancy, antepartum ?--Anticipatory guidance about next visits/weeks of pregnancy given.  ?--Vertex position confirmed by Leopolds and bedside  US in office today ?--Reviewed labor readiness with patient including the [place], evening primrose oil, and raspberry leaf tea.   ? ?Term labor symptoms and general obstetric precautions including but not limited to vaginal bleeding, contractions, leaking of fluid and fetal movement were reviewed in detail with the patient. ?Please refer to After Visit Summary for other counseling recommendations.  ? ?Return in about 1 week (around 06/18/2021) for LOB, Any provider. ? ?No future appointments. ? ? ?Fatima Blank, CNM  ?

## 2021-06-12 LAB — CERVICOVAGINAL ANCILLARY ONLY
Chlamydia: NEGATIVE
Comment: NEGATIVE
Comment: NORMAL
Neisseria Gonorrhea: NEGATIVE

## 2021-06-14 LAB — CULTURE, BETA STREP (GROUP B ONLY): Strep Gp B Culture: POSITIVE — AB

## 2021-06-14 LAB — URINE CULTURE, OB REFLEX

## 2021-06-14 LAB — CULTURE, OB URINE

## 2021-06-19 ENCOUNTER — Ambulatory Visit (INDEPENDENT_AMBULATORY_CARE_PROVIDER_SITE_OTHER): Payer: Medicaid Other | Admitting: Family Medicine

## 2021-06-19 VITALS — BP 114/65 | HR 83 | Wt 190.6 lb

## 2021-06-19 DIAGNOSIS — Z3480 Encounter for supervision of other normal pregnancy, unspecified trimester: Secondary | ICD-10-CM

## 2021-06-19 DIAGNOSIS — B951 Streptococcus, group B, as the cause of diseases classified elsewhere: Secondary | ICD-10-CM

## 2021-06-19 DIAGNOSIS — O2340 Unspecified infection of urinary tract in pregnancy, unspecified trimester: Secondary | ICD-10-CM

## 2021-06-19 DIAGNOSIS — Z3A37 37 weeks gestation of pregnancy: Secondary | ICD-10-CM

## 2021-06-19 NOTE — Progress Notes (Signed)
? ? ?  Subjective:  ?Kristen Grimes is a 31 y.o. G5P4004 at [redacted]w[redacted]d being seen today for ongoing prenatal care.  She is currently monitored for the following issues for this low-risk pregnancy and has Supervision of other normal pregnancy, antepartum; [place] interval between pregnancies affecting pregnancy, antepartum; and Group B streptococcus urinary tract infection affecting pregnancy on their problem list. ? ?Patient reports no complaints.  Contractions: Irritability. Vag. Bleeding: None.  Movement: Present. Denies leaking of fluid.  ? ?The following portions of the patient's history were reviewed and updated as appropriate: allergies, current medications, past family history, past medical history, past social history, past surgical history and problem list.  ? ?Objective:  ? ?Vitals:  ? 06/19/21 0935  ?BP: 114/65  ?Pulse: 83  ?Weight: 190 lb 9.6 oz (86.5 kg)  ? ? ?Fetal Status: Fetal Heart Rate (bpm): 138 Fundal Height: 37 cm Movement: Present  Presentation: Vertex by leopolds  ? ?General:  Alert, oriented and cooperative. Patient is in no acute distress.  ?Skin: Skin is warm and dry. No rash noted.   ?Cardiovascular: Normal heart rate noted  ?Respiratory: Normal respiratory effort, no problems with respiration noted  ?Abdomen: Soft, gravid, appropriate for gestational age. Pain/Pressure: Present     ?Pelvic:  Cervical exam deferred        ?Extremities: Normal range of motion.  Edema: None  ?Mental Status: Normal mood and affect. Normal behavior. Normal judgment and thought content.  ? ? ?Assessment and Plan:  ?Pregnancy: [id number] at [redacted]w[redacted]d ? ?1. Supervision of other normal pregnancy, antepartum ?Doing well with normal fetal movement.  ? ?2. [redacted] weeks gestation of pregnancy ? ?3. Group B Streptococcus urinary tract infection affecting pregnancy, antepartum ?Needs abx in labor.  ? ?Term labor symptoms and general obstetric precautions including but not limited to vaginal bleeding, contractions, leaking of fluid and  fetal movement were reviewed in detail with the patient. ?Please refer to After Visit Summary for other counseling recommendations.  ? ?Return in about 1 week (around 06/26/2021) for LROB. ? ? ?Allayne Stack, DO ?

## 2021-06-19 NOTE — Progress Notes (Signed)
Pt reports fetal movement with some contractions and irritability. ?

## 2021-06-26 ENCOUNTER — Ambulatory Visit (INDEPENDENT_AMBULATORY_CARE_PROVIDER_SITE_OTHER): Payer: Medicaid Other | Admitting: Certified Nurse Midwife

## 2021-06-26 VITALS — BP 119/73 | HR 87 | Wt 192.0 lb

## 2021-06-26 DIAGNOSIS — Z3493 Encounter for supervision of normal pregnancy, unspecified, third trimester: Secondary | ICD-10-CM

## 2021-06-26 DIAGNOSIS — Z3A38 38 weeks gestation of pregnancy: Secondary | ICD-10-CM

## 2021-06-26 NOTE — Progress Notes (Signed)
? ?  PRENATAL VISIT NOTE ? ?Subjective:  ?Kristen Grimes is a 31 y.o. G5P4004 at [redacted]w[redacted]d being seen today for ongoing prenatal care.  She is currently monitored for the following issues for this low-risk pregnancy and has Supervision of other normal pregnancy, antepartum; [place] interval between pregnancies affecting pregnancy, antepartum; and Group B streptococcus urinary tract infection affecting pregnancy on their problem list. ? ?Patient reports no complaints.  Contractions: Irritability. Vag. Bleeding: None.  Movement: Present. Denies leaking of fluid.  ? ?The following portions of the patient's history were reviewed and updated as appropriate: allergies, current medications, past family history, past medical history, past social history, past surgical history and problem list.  ? ?Objective:  ? ?Vitals:  ? 06/26/21 0947  ?BP: 119/73  ?Pulse: 87  ?Weight: 192 lb (87.1 kg)  ? ? ?Fetal Status: Fetal Heart Rate (bpm): 146 Fundal Height: 38 cm Movement: Present    ? ?General:  Alert, oriented and cooperative. Patient is in no acute distress.  ?Skin: Skin is warm and dry. No rash noted.   ?Cardiovascular: Normal heart rate noted  ?Respiratory: Normal respiratory effort, no problems with respiration noted  ?Abdomen: Soft, gravid, appropriate for gestational age.  Pain/Pressure: Present     ?Pelvic: Cervical exam deferred        ?Extremities: Normal range of motion.     ?Mental Status: Normal mood and affect. Normal behavior. Normal judgment and thought content.  ? ?Assessment and Plan:  ?Pregnancy: [id number] at [redacted]w[redacted]d ?1. Supervision of low-risk pregnancy, third trimester ?- Doing well, feeling regular and vigorous fetal movement ? ?2. [redacted] weeks gestation of pregnancy ?- Routine OB care  ? ?3. GBS carrier, + culture, currently pregnant ?- Will treat in labor ? ?Term labor symptoms and general obstetric precautions including but not limited to vaginal bleeding, contractions, leaking of fluid and fetal movement were reviewed  in detail with the patient. ?Please refer to After Visit Summary for other counseling recommendations.  ? ?Return in about 1 week (around 07/03/2021) for IN-PERSON, LOB. ? ?Future Appointments  ?Date Time Provider Department Center  ?07/03/2021 11:15 AM Brand Males, CNM [facility] None  ? ? ?Bernerd Limbo, CNM ?

## 2021-07-03 ENCOUNTER — Other Ambulatory Visit: Payer: Self-pay | Admitting: Advanced Practice Midwife

## 2021-07-03 ENCOUNTER — Ambulatory Visit (INDEPENDENT_AMBULATORY_CARE_PROVIDER_SITE_OTHER): Payer: Medicaid Other

## 2021-07-03 VITALS — BP 111/72 | HR 72 | Wt 188.0 lb

## 2021-07-03 DIAGNOSIS — Z3A39 39 weeks gestation of pregnancy: Secondary | ICD-10-CM

## 2021-07-03 DIAGNOSIS — O99820 Streptococcus B carrier state complicating pregnancy: Secondary | ICD-10-CM

## 2021-07-03 DIAGNOSIS — Z3480 Encounter for supervision of other normal pregnancy, unspecified trimester: Secondary | ICD-10-CM

## 2021-07-03 NOTE — Progress Notes (Signed)
? ?  PRENATAL VISIT NOTE ? ?Subjective:  ?Kristen Grimes is a 31 y.o. G5P4004 at [redacted]w[redacted]d being seen today for ongoing prenatal care.  She is currently monitored for the following issues for this low-risk pregnancy and has Supervision of other normal pregnancy, antepartum; Schwalbe interval between pregnancies affecting pregnancy, antepartum; and Group B streptococcus urinary tract infection affecting pregnancy on their problem list. ? ?Patient reports no complaints.  Contractions: Irritability. Vag. Bleeding: None.  Movement: Present. Denies leaking of fluid.  ? ?The following portions of the patient's history were reviewed and updated as appropriate: allergies, current medications, past family history, past medical history, past social history, past surgical history and problem list.  ? ?Objective:  ? ?Vitals:  ? 07/03/21 1139  ?BP: 111/72  ?Pulse: 72  ?Weight: 188 lb (85.3 kg)  ? ? ?Fetal Status: Fetal Heart Rate (bpm): 141 Fundal Height: 39 cm Movement: Present  Presentation: Vertex ? ?General:  Alert, oriented and cooperative. Patient is in no acute distress.  ?Skin: Skin is warm and dry. No rash noted.   ?Cardiovascular: Normal heart rate noted  ?Respiratory: Normal respiratory effort, no problems with respiration noted  ?Abdomen: Soft, gravid, appropriate for gestational age.  Pain/Pressure: Present     ?Pelvic: Cervical exam performed in the presence of a chaperone Dilation: 1 Effacement (%): Thick Station: Ballotable  ?Extremities: Normal range of motion.  Edema: None  ?Mental Status: Normal mood and affect. Normal behavior. Normal judgment and thought content.  ? ?Assessment and Plan:  ?Pregnancy: [id number] at [redacted]w[redacted]d ?1. Supervision of other normal pregnancy, antepartum ?- Routine OB. Doing well, no concerns ?- Desires 40 week IOL. Form completed, orders placed ? ?2. [redacted] weeks gestation of pregnancy ?- Endorses active fetal movement ?- FH appropriate ? ?3. GBS (group B Streptococcus carrier), +RV culture, currently  pregnant ?- Treat in labor ? ? ?Term labor symptoms and general obstetric precautions including but not limited to vaginal bleeding, contractions, leaking of fluid and fetal movement were reviewed in detail with the patient. ?Please refer to After Visit Summary for other counseling recommendations.  ? ?Return in about 1 week (around 07/10/2021). ? ?Future Appointments  ?Date Time Provider Department Center  ?07/09/2021  6:45 AM [facility] SCHED ROOM [facility] None  ?07/11/2021  9:35 AM Brock Bad, MD [facility] None  ? ? ?Brand Males, CNM ? ?

## 2021-07-04 ENCOUNTER — Telehealth (HOSPITAL_COMMUNITY): Payer: Self-pay | Admitting: *Deleted

## 2021-07-04 ENCOUNTER — Encounter (HOSPITAL_COMMUNITY): Payer: Self-pay | Admitting: *Deleted

## 2021-07-04 NOTE — Telephone Encounter (Signed)
Preadmission screen  

## 2021-07-06 ENCOUNTER — Other Ambulatory Visit: Payer: Self-pay

## 2021-07-06 ENCOUNTER — Inpatient Hospital Stay (HOSPITAL_COMMUNITY)
Admission: AD | Admit: 2021-07-06 | Discharge: 2021-07-08 | DRG: 807 | Disposition: A | Discharge status: Home / Self Care | Payer: Medicaid Other | Attending: Obstetrics and Gynecology | Admitting: Obstetrics and Gynecology

## 2021-07-06 ENCOUNTER — Inpatient Hospital Stay (HOSPITAL_COMMUNITY): Payer: Medicaid Other | Admitting: Anesthesiology

## 2021-07-06 ENCOUNTER — Encounter (HOSPITAL_COMMUNITY): Payer: Self-pay | Admitting: Obstetrics & Gynecology

## 2021-07-06 DIAGNOSIS — O368330 Maternal care for abnormalities of the fetal heart rate or rhythm, third trimester, not applicable or unspecified: Secondary | ICD-10-CM

## 2021-07-06 DIAGNOSIS — Z6790 Unspecified blood type, Rh positive: Secondary | ICD-10-CM

## 2021-07-06 DIAGNOSIS — O99824 Streptococcus B carrier state complicating childbirth: Secondary | ICD-10-CM | POA: Diagnosis not present

## 2021-07-06 DIAGNOSIS — O4693 Antepartum hemorrhage, unspecified, third trimester: Principal | ICD-10-CM

## 2021-07-06 DIAGNOSIS — D649 Anemia, unspecified: Secondary | ICD-10-CM | POA: Diagnosis not present

## 2021-07-06 DIAGNOSIS — Z3A39 39 weeks gestation of pregnancy: Secondary | ICD-10-CM

## 2021-07-06 DIAGNOSIS — B951 Streptococcus, group B, as the cause of diseases classified elsewhere: Secondary | ICD-10-CM

## 2021-07-06 DIAGNOSIS — Z3689 Encounter for other specified antenatal screening: Secondary | ICD-10-CM

## 2021-07-06 DIAGNOSIS — O9902 Anemia complicating childbirth: Secondary | ICD-10-CM | POA: Diagnosis not present

## 2021-07-06 DIAGNOSIS — O99820 Streptococcus B carrier state complicating pregnancy: Secondary | ICD-10-CM

## 2021-07-06 LAB — RPR: RPR Ser Ql: NONREACTIVE

## 2021-07-06 LAB — CBC
HCT: 34 % — ABNORMAL LOW (ref 36.0–46.0)
Hemoglobin: 11.1 g/dL — ABNORMAL LOW (ref 12.0–15.0)
MCH: 28.8 pg (ref 26.0–34.0)
MCHC: 32.6 g/dL (ref 30.0–36.0)
MCV: 88.1 fL (ref 80.0–100.0)
Platelets: 233 10*3/uL (ref 150–400)
RBC: 3.86 MIL/uL — ABNORMAL LOW (ref 3.87–5.11)
RDW: 14.4 % (ref 11.5–15.5)
WBC: 4.8 10*3/uL (ref 4.0–10.5)
nRBC: 0 % (ref 0.0–0.2)

## 2021-07-06 LAB — TYPE AND SCREEN
ABO/RH(D): B POS
Antibody Screen: NEGATIVE

## 2021-07-06 MED ORDER — SOD CITRATE-CITRIC ACID 500-334 MG/5ML PO SOLN: 30.0000 mL | ORAL | Status: DC | PRN

## 2021-07-06 MED ORDER — LIDOCAINE HCL (PF) 1 % IJ SOLN: 30.0000 mL | INTRAMUSCULAR | Status: DC | PRN

## 2021-07-06 MED ORDER — ACETAMINOPHEN 325 MG PO TABS
650.0000 mg | ORAL_TABLET | ORAL | Status: DC | PRN
  Administered 2021-07-07: 650 mg via ORAL
  Filled 2021-07-06: qty 2

## 2021-07-06 MED ORDER — OXYTOCIN-SODIUM CHLORIDE 30-0.9 UT/500ML-% IV SOLN
1.0000 m[IU]/min | INTRAVENOUS | Status: DC
  Administered 2021-07-06: 2 m[IU]/min via INTRAVENOUS

## 2021-07-06 MED ORDER — DIPHENHYDRAMINE HCL 25 MG PO CAPS: 25.0000 mg | ORAL_CAPSULE | Freq: Four times a day (QID) | ORAL | Status: DC | PRN

## 2021-07-06 MED ORDER — ZOLPIDEM TARTRATE 5 MG PO TABS: 5.0000 mg | ORAL_TABLET | Freq: Every evening | ORAL | Status: DC | PRN

## 2021-07-06 MED ORDER — SIMETHICONE 80 MG PO CHEW: 80.0000 mg | CHEWABLE_TABLET | ORAL | Status: DC | PRN

## 2021-07-06 MED ORDER — WITCH HAZEL-GLYCERIN EX PADS: 1.0000 "application " | MEDICATED_PAD | CUTANEOUS | Status: DC | PRN

## 2021-07-06 MED ORDER — OXYCODONE-ACETAMINOPHEN 5-325 MG PO TABS: 1.0000 | ORAL_TABLET | ORAL | Status: DC | PRN

## 2021-07-06 MED ORDER — LACTATED RINGERS IV SOLN: INTRAVENOUS | Status: DC

## 2021-07-06 MED ORDER — DIPHENHYDRAMINE HCL 50 MG/ML IJ SOLN: 12.5000 mg | INTRAMUSCULAR | Status: DC | PRN

## 2021-07-06 MED ORDER — EPHEDRINE 5 MG/ML INJ: 10.0000 mg | INTRAVENOUS | Status: DC | PRN

## 2021-07-06 MED ORDER — FENTANYL-BUPIVACAINE-NACL 0.5-0.125-0.9 MG/250ML-% EP SOLN
12.0000 mL/h | EPIDURAL | Status: DC | PRN
  Administered 2021-07-06: 12 mL/h via EPIDURAL

## 2021-07-06 MED ORDER — SODIUM CHLORIDE 0.9 % IV SOLN
5.0000 10*6.[IU] | Freq: Once | INTRAVENOUS | Status: AC
  Administered 2021-07-06: 5 10*6.[IU] via INTRAVENOUS
  Filled 2021-07-06: qty 5

## 2021-07-06 MED ORDER — IBUPROFEN 600 MG PO TABS
600.0000 mg | ORAL_TABLET | Freq: Four times a day (QID) | ORAL | Status: DC
  Administered 2021-07-07 – 2021-07-08 (×7): 600 mg via ORAL
  Filled 2021-07-06 (×7): qty 1

## 2021-07-06 MED ORDER — PHENYLEPHRINE 80 MCG/ML (10ML) SYRINGE FOR IV PUSH (FOR BLOOD PRESSURE SUPPORT): 80.0000 ug | PREFILLED_SYRINGE | INTRAVENOUS | Status: DC | PRN

## 2021-07-06 MED ORDER — COCONUT OIL OIL
1.0000 | TOPICAL_OIL | Status: DC | PRN
Start: 2021-07-06 — End: 2021-07-08

## 2021-07-06 MED ORDER — PENICILLIN G POT IN DEXTROSE 60000 UNIT/ML IV SOLN
3.0000 10*6.[IU] | INTRAVENOUS | Status: DC
  Administered 2021-07-06: 3 10*6.[IU] via INTRAVENOUS
  Filled 2021-07-06: qty 50

## 2021-07-06 MED ORDER — ONDANSETRON HCL 4 MG PO TABS
4.0000 mg | ORAL_TABLET | ORAL | Status: DC | PRN
Start: 2021-07-06 — End: 2021-07-08

## 2021-07-06 MED ORDER — TERBUTALINE SULFATE 1 MG/ML IJ SOLN: 0.2500 mg | Freq: Once | INTRAMUSCULAR | Status: DC | PRN

## 2021-07-06 MED ORDER — TETANUS-DIPHTH-ACELL PERTUSSIS 5-2.5-18.5 LF-MCG/0.5 IM SUSY: 0.5000 mL | PREFILLED_SYRINGE | Freq: Once | INTRAMUSCULAR | Status: DC

## 2021-07-06 MED ORDER — FENTANYL-BUPIVACAINE-NACL 0.5-0.125-0.9 MG/250ML-% EP SOLN
EPIDURAL | Status: AC
  Filled 2021-07-06: qty 250

## 2021-07-06 MED ORDER — OXYTOCIN-SODIUM CHLORIDE 30-0.9 UT/500ML-% IV SOLN
2.5000 [IU]/h | INTRAVENOUS | Status: DC
  Filled 2021-07-06: qty 500

## 2021-07-06 MED ORDER — OXYCODONE-ACETAMINOPHEN 5-325 MG PO TABS: 2.0000 | ORAL_TABLET | ORAL | Status: DC | PRN

## 2021-07-06 MED ORDER — LIDOCAINE HCL (PF) 1 % IJ SOLN
INTRAMUSCULAR | Status: DC | PRN
  Administered 2021-07-06 (×2): 4 mL via EPIDURAL

## 2021-07-06 MED ORDER — ONDANSETRON HCL 4 MG/2ML IJ SOLN: 4.0000 mg | Freq: Four times a day (QID) | INTRAMUSCULAR | Status: DC | PRN

## 2021-07-06 MED ORDER — ACETAMINOPHEN 325 MG PO TABS: 650.0000 mg | ORAL_TABLET | ORAL | Status: DC | PRN

## 2021-07-06 MED ORDER — LACTATED RINGERS IV SOLN
500.0000 mL | Freq: Once | INTRAVENOUS | Status: AC
  Administered 2021-07-06: 500 mL via INTRAVENOUS

## 2021-07-06 MED ORDER — OXYTOCIN BOLUS FROM INFUSION: 333.0000 mL | Freq: Once | INTRAVENOUS | Status: DC

## 2021-07-06 MED ORDER — ONDANSETRON HCL 4 MG/2ML IJ SOLN: 4.0000 mg | INTRAMUSCULAR | Status: DC | PRN

## 2021-07-06 MED ORDER — PRENATAL MULTIVITAMIN CH
1.0000 | ORAL_TABLET | Freq: Every day | ORAL | Status: DC
  Administered 2021-07-07 – 2021-07-08 (×2): 1 via ORAL
  Filled 2021-07-06 (×2): qty 1

## 2021-07-06 MED ORDER — DIBUCAINE (PERIANAL) 1 % EX OINT
1.0000 | TOPICAL_OINTMENT | CUTANEOUS | Status: DC | PRN
Start: 2021-07-06 — End: 2021-07-08

## 2021-07-06 MED ORDER — SENNOSIDES-DOCUSATE SODIUM 8.6-50 MG PO TABS
2.0000 | ORAL_TABLET | Freq: Every day | ORAL | Status: DC
Start: 2021-07-07 — End: 2021-07-08
  Administered 2021-07-07 – 2021-07-08 (×2): 2 via ORAL
  Filled 2021-07-06 (×2): qty 2

## 2021-07-06 MED ORDER — LACTATED RINGERS IV SOLN: 500.0000 mL | INTRAVENOUS | Status: DC | PRN

## 2021-07-06 MED ORDER — BENZOCAINE-MENTHOL 20-0.5 % EX AERO: 1.0000 "application " | INHALATION_SPRAY | CUTANEOUS | Status: DC | PRN

## 2021-07-06 NOTE — MAU Note (Signed)
Pt reports to mau with c/o vag bleeding that started when she woke up this morning.  Pt denies reg ctx but endorses pelvic pressure.  +FM  ?

## 2021-07-06 NOTE — H&P (Addendum)
Kristen Grimes is a 31 y.o. female presenting for vaginal bleeding. Patient reports the vaginal bleeding started about one hour ago. She woke up to use the restroom and saw some bleeding when wiping. Then she experienced a gush of blood and wore some tissue in her underwear to come to MAU. Patient also reports feeling intense pelvic pressure and like she has to poop, but reports having a bowel movement last night. Patient reports only two contractions earlier today and denies other concerns. ? ?OB History   ? ? Gravida  ?5  ? Para  ?4  ? Term  ?4  ? Preterm  ?   ? AB  ?   ? Living  ?4  ?  ? ? SAB  ?   ? IAB  ?   ? Ectopic  ?   ? Multiple  ?0  ? Live Births  ?4  ?   ?  ?  ? ?Past Medical History:  ?Diagnosis Date  ? Anemia affecting pregnancy, antepartum 01/27/2018  ? GBS (group B Streptococcus carrier), +RV culture, currently pregnant 07/25/2018  ? Gonorrhea affecting pregnancy 07/25/2018  ? Group B streptococcal carriage complicating pregnancy 07/25/2018  ? Medical history non-contributory   ? ?Past Surgical History:  ?Procedure Laterality Date  ? NO PAST SURGERIES    ? ?Family History: family history includes Multiple sclerosis in her mother. ?Social History:  reports that she has never smoked. She has never used smokeless tobacco. She reports that she does not currently use alcohol. She reports that she does not use drugs. ? ?  ?Maternal Diabetes: No ?Genetic Screening: Normal ?Maternal Ultrasounds/Referrals: Normal ?Fetal Ultrasounds or other Referrals:  None ?Maternal Substance Abuse:  No ?Significant Maternal Medications:  None ?Significant Maternal Lab Results:  Group B Strep positive ?Other Comments:  None ? ?Review of Systems  ?Genitourinary:  Positive for vaginal bleeding.  ?All other systems reviewed and are negative. ?History ? ?Dilation: 1 ?Effacement (%): 50, 40 ?Exam by:: elaina edwards rn ?Last menstrual period 10/08/2020, unknown if currently breastfeeding. ?Maternal Exam:  ?Introitus: Normal vulva.   ? ?Physical Exam ?Vitals and nursing note reviewed. Exam conducted with a chaperone present.  ?Constitutional:   ?   General: She is not in acute distress. ?   Appearance: Normal appearance. She is not ill-appearing, toxic-appearing or diaphoretic.  ?Abdominal:  ?   Palpations: Abdomen is soft.  ?Genitourinary: ?   General: Normal vulva.  ?   Labia:     ?   Right: No rash, tenderness or lesion.     ?   Left: No rash, tenderness or lesion.   ?   Vagina: Bleeding present.  ?   Comments: Cervix visually closed. Moderate amount of dark red/pink blood present in vagina, no brisk, bright red bleeding noted. Not mixed with mucus, more than expected for bloody show, especially given lack of contractions. Patient also unable to sit still in bed due to reported pressure and feeling like she has to have a bowel movement, requesting epidural. ?Skin: ?   General: Skin is warm and dry.  ?Neurological:  ?   Mental Status: She is alert and oriented to person, place, and time.  ?Psychiatric:     ?   Mood and Affect: Mood normal.     ?   Behavior: Behavior normal.     ?   Thought Content: Thought content normal.     ?   Judgment: Judgment normal.  ?  ?Prenatal labs: ?ABO, Rh: B/Positive/-- (  11/03 1549) ?Antibody: Negative (11/03 1549) ?Rubella: 2.95 (11/03 1549) ?RPR: Non Reactive (02/03 1028)  ?HBsAg: Negative (11/03 1549)  ?HIV: Non Reactive (02/03 1028)  ?GBS: Positive/-- (03/28 1138)  ? ?EFM: reactive ?      -baseline: 135 ?      -variability: moderate ?      -accels: present, 15x15 ?      -decels: absent ?      -TOCO: quiet, though difficult to assess due to patient movement ? ?Pt informed that the ultrasound is considered a limited OB ultrasound and is not intended to be a complete ultrasound exam.  Patient also informed that the ultrasound is not being completed with the intent of assessing for fetal or placental anomalies or any pelvic abnormalities.  Explained that the purpose of today?s ultrasound is to assess for   presentation (VTX).  Patient acknowledges the purpose of the exam and the limitations of the study.   ? ?Assessment/Plan: ?1. Vaginal bleeding in pregnancy, third trimester   ?2. Blood type, Rh positive   ?3. NST (non-stress test) reactive   ?4. [redacted] weeks gestation of pregnancy   ? ?-admit to L&D for vaginal bleeding at 39 weeks ?-RH positive ? ?Joni Reining E Dominiq Fontaine ?07/06/2021, 9:34 AM ? ? ? ? ?

## 2021-07-06 NOTE — Discharge Summary (Signed)
? ?  Postpartum Discharge Summary ? ?   ?Patient Name: Kristen Grimes ?DOB: [birth year] ?MRN: [id number] ? ?Date of admission: 07/06/2021 ?Delivery date:07/06/2021  ?Delivering provider: Fatima Blank A  ?Date of discharge: 07/08/2021 ? ?Admitting diagnosis: Indication for care in labor or delivery [O75.9] ?Intrauterine pregnancy: [redacted]w[redacted]d    ?Secondary diagnosis:  Principal Problem: ?  Indication for care in labor or delivery ? ?Additional problems:  None    ?Discharge diagnosis: Term Pregnancy Delivered                                              ?Post partum procedures: None ?Augmentation: Pitocin and IP Foley ?Complications: None ? ?Hospital course: Induction of Labor With Vaginal Delivery   ?31y.o. yo G5P4004 at 335w4das admitted to the hospital 07/06/2021 for induction of labor.  Indication for induction:  vaginal bleeding .  Patient had an uncomplicated labor course as follows: ?Membrane Rupture Time/Date: 5:23 PM ,07/06/2021   ?Delivery Method:Vaginal, Spontaneous  ?Episiotomy: None  ?Lacerations:  None  ?Details of delivery can be found in separate delivery note.  Patient had a routine postpartum course. Patient is discharged home 07/08/21. ? ?Newborn Data: ?Birth date:07/06/2021  ?Birth time:5:24 PM  ?Gender:Female  ?Living status:Living  ?Apgars:9 ,9  ?Weight:3185 g  ? ?Magnesium Sulfate received: No ?BMZ received: No ?Rhophylac:No ?MMR:No ?T-DaP: declined ?Flu: No ?Transfusion:No ? ?Physical exam  ?Vitals:  ? 07/07/21 0954 07/07/21 1819 07/07/21 2125 07/08/21 0540  ?BP: 114/71 111/75 112/82 114/72  ?Pulse: 69 82 79 71  ?Resp: '16 16 18 16  ' ?Temp: 98.2 ?F (36.8 ?C) 98.2 ?F (36.8 ?C) 98 ?F (36.7 ?C) 97.8 ?F (36.6 ?C)  ?TempSrc: Oral Oral Oral Oral  ?SpO2:  99% 100% 100%  ?Weight:      ?Height:      ? ?General: alert, cooperative, and no distress ?Lochia: appropriate ?Uterine Fundus: firm ?Incision: N/A ?DVT Evaluation: No evidence of DVT seen on physical exam. ?Negative Homan's sign. ?No cords or calf  tenderness. ?Labs: ?Lab Results  ?Component Value Date  ? WBC 4.8 07/06/2021  ? HGB 11.1 (L) 07/06/2021  ? HCT 34.0 (L) 07/06/2021  ? MCV 88.1 07/06/2021  ? PLT 233 07/06/2021  ? ?   ? View : No data to display.  ?  ?  ?  ? ?Edinburgh Score: ? ?  07/07/2021  ?  7:38 AM  ?EdFlavia Shipperostnatal Depression Scale Screening Tool  ?I have been able to laugh and see the funny side of things. 0  ?I have looked forward with enjoyment to things. 0  ?I have blamed myself unnecessarily when things went wrong. 0  ?I have been anxious or worried for no good reason. 0  ?I have felt scared or panicky for no good reason. 0  ?Things have been getting on top of me. 0  ?I have been so unhappy that I have had difficulty sleeping. 0  ?I have felt sad or miserable. 0  ?I have been so unhappy that I have been crying. 0  ?The thought of harming myself has occurred to me. 0  ?Edinburgh Postnatal Depression Scale Total 0  ? ? ? ?After visit meds:  ?Allergies as of 07/08/2021   ?No Known Allergies ?  ? ?  ?Medication List  ?  ? ?TAKE these medications   ? ?acetaminophen 325 MG tablet ?Commonly known  as: Tylenol ?Take 2 tablets (650 mg total) by mouth every 4 (four) hours as needed (for pain scale < 4). ?  ?coconut oil Oil ?Apply 1 application. topically as needed. ?  ?ibuprofen 600 MG tablet ?Commonly known as: ADVIL ?Take 1 tablet (600 mg total) by mouth every 6 (six) hours. ?  ?pantoprazole 40 MG tablet ?Commonly known as: Protonix ?Take 1 tablet (40 mg total) by mouth daily. ?  ?Vitafol Ultra 29-0.6-0.4-200 MG Caps ?Take 1 capsule by mouth daily. ?  ? ?  ? ? ? ?Discharge home in stable condition ?Infant Feeding: Bottle and Breast ?Infant Disposition:home with mother ?Discharge instruction: per After Visit Summary and Postpartum booklet. ?Activity: Advance as tolerated. Pelvic rest for 6 weeks.  ?Diet: routine diet ?Future Appointments: ?Future Appointments  ?Date Time Provider [place]  ?08/14/2021  3:50 PM Nugent, Gerrie Nordmann, NP [facility]  None  ? ?Follow up Visit: ? Follow-up Information   ? ? [facility].   ?Specialty: Obstetrics and Gynecology ?Contact information: ?[address], [address] ?[place] ?[phone] ? ?  ?  ? ?  ?  ? ?  ? ? ?Message sent to [place] on 07/06/21: ? ?Please schedule this patient for a In person postpartum visit in 4 weeks with the following provider: Any provider. ?Additional Postpartum F/U: none   ?Low risk pregnancy complicated by:  vaginal bleeding in labor ?Delivery mode:  Vaginal, Spontaneous  ?Anticipated Birth Control:  BTL done PP ? ? ?07/08/2021 ?Lenoria Chime, MD ? ? ? ?

## 2021-07-06 NOTE — Lactation Note (Addendum)
This note was copied from a baby's chart. ?Lactation Consultation Note ? ?Patient Name: Kristen Grimes ?Today's Date: 07/06/2021 ?Reason for consult: Initial assessment;1st time breastfeeding;Term ?Age:31 years ?Mom requested LC assistance. ?Mom attempted latch infant at the breast, infant would not open his mouth and sucked on his tongue. ?LC attempted suck training but infant sucks on his tongue, was tongue thrusting and would not extend his lower jaw at this time for latch. ?LC discussed hand expression and used breast model, dad assisted mom with hand expression and infant was given 6 mls of EBM by spoon.  ?Look below at mom's nipple type that responds well to stimulation and easily everts outward. ?Mom will continue to do lots of skin to skin with infant. ?Mom made aware of O/P services, breastfeeding support groups, community resources, and our phone # for post-discharge questions.   ?Mom's plan: ?1- Mom will continue to try latch infant at the breast, BF according to hunger cues, on demand, 8 to 12+ or more times, skin to skin. ?2- Mom will continue to ask RN/LC for latch assistance if needed. ?3- Mom will hand express and give infant back her EBM if infant doesn't latch at the breast.  ?Maternal Data ?Has patient been taught Hand Expression?: Yes ?Does the patient have breastfeeding experience prior to this delivery?: Yes ?How long did the patient breastfeed?: Mom did not latch her other 4 children at the breast, she pumped only. ? ?Feeding ?Mother's Current Feeding Choice: Breast Milk and Formula ? ?LATCH Score ?Latch: Too sleepy or reluctant, no latch achieved, no sucking elicited. ? ?Audible Swallowing: None ? ?Type of Nipple: Inverted ? ?Comfort (Breast/Nipple): Soft / non-tender ? ?Hold (Positioning): Assistance needed to correctly position infant at breast and maintain latch. ? ?LATCH Score: 3 ? ? ?Lactation Tools Discussed/Used ?  ? ?Interventions ?Interventions: Breast feeding basics  reviewed;Assisted with latch;Skin to skin;Hand express;Breast compression;Adjust position;Support pillows;Position options;Expressed milk;LC Services brochure ? ?Discharge ?  ? ?Consult Status ?Consult Status: Follow-up ?Date: 07/07/21 ?Follow-up type: In-patient ? ? ? ?Danelle Earthly ?07/06/2021, 11:39 PM ? ? ? ?

## 2021-07-06 NOTE — Progress Notes (Signed)
Kristen Grimes is a 31 y.o. [id number] at [redacted]w[redacted]d admitted for induction of labor due to vaginal bleeding. ? ?Subjective: ?Pt has epidural and contraction pain improved, but still feeling lower abdominal cramping.  ? ?Objective: ?BP 112/68   Pulse 75   Temp 98.1 ?F (36.7 ?C) (Oral)   Resp 16   Ht 5\' 8"  (1.727 m)   Wt 85.3 kg   LMP 10/08/2020 (Exact Date)   SpO2 99%   BMI 28.59 kg/m?  ?No intake/output data recorded. ?Total I/O ?In: 952.7 [I.V.:902.7; IV Piggyback:50] ?Out: -  ? ?FHT:  FHR: 135 bpm, variability: moderate,  accelerations:  Present,  decelerations:  Present early ?UC:   regular, every 2-3 minutes ?SVE:   Dilation: 6 ?Effacement (%): 70 ?Station: -2, -3 ?Exam by:: Polos RN ? ?Scant mucus tinged bleeding on pad. ? ?Labs: ?Lab Results  ?Component Value Date  ? WBC 4.8 07/06/2021  ? HGB 11.1 (L) 07/06/2021  ? HCT 34.0 (L) 07/06/2021  ? MCV 88.1 07/06/2021  ? PLT 233 07/06/2021  ? ? ?Assessment / Plan: ?Augmentation of labor, progressing well ? ?Labor:  Will continue to work on pain control with epidural, may call anesthesia as needed.  FB out and RN continuing to titrate Pitocin up for adequate contractions.  Will recheck in 2-4 hours or PRN.  ?Preeclampsia:   n/a ?Fetal Wellbeing:  Category I ?Pain Control:  Epidural ?I/D:   GBS positive, on PCN ?Anticipated MOD:  NSVD ? ?07/08/2021 Left[place]-Kirby ?07/06/2021, 4:21 PM ? ? ?

## 2021-07-06 NOTE — Anesthesia Preprocedure Evaluation (Signed)
Anesthesia Evaluation  ?Patient identified by MRN, date of birth, ID band ?Patient awake ? ? ? ?Reviewed: ?Allergy & Precautions, Patient's Chart, lab work & pertinent test results ? ?History of Anesthesia Complications ?Negative for: history of anesthetic complications ? ?Airway ?Mallampati: II ? ?TM Distance: >3 FB ?Neck ROM: Full ? ? ? Dental ?no notable dental hx. ? ?  ?Pulmonary ?neg pulmonary ROS,  ?  ?Pulmonary exam normal ? ? ? ? ? ? ? Cardiovascular ?negative cardio ROS ?Normal cardiovascular exam ? ? ?  ?Neuro/Psych ?negative neurological ROS ? negative psych ROS  ? GI/Hepatic ?negative GI ROS, Neg liver ROS,   ?Endo/Other  ?negative endocrine ROS ? Renal/GU ?negative Renal ROS  ?negative genitourinary ?  ?Musculoskeletal ?negative musculoskeletal ROS ?(+)  ? Abdominal ?  ?Peds ? Hematology ? ?(+) Blood dyscrasia (Hgb 11.1), anemia ,   ?Anesthesia Other Findings ?Day of surgery medications reviewed with patient. ? Reproductive/Obstetrics ?(+) Pregnancy ? ?  ? ? ? ? ? ? ? ? ? ? ? ? ? ?  ?  ? ? ? ? ? ? ? ? ?Anesthesia Physical ?Anesthesia Plan ? ?ASA: 2 ? ?Anesthesia Plan: Epidural  ? ?Post-op Pain Management:   ? ?Induction:  ? ?PONV Risk Score and Plan: Treatment may vary due to age or medical condition ? ?Airway Management Planned: Natural Airway ? ?Additional Equipment: Fetal Monitoring ? ?Intra-op Plan:  ? ?Post-operative Plan:  ? ?Informed Consent: I have reviewed the patients History and Physical, chart, labs and discussed the procedure including the risks, benefits and alternatives for the proposed anesthesia with the patient or authorized representative who has indicated his/her understanding and acceptance.  ? ? ? ? ? ?Plan Discussed with:  ? ?Anesthesia Plan Comments:   ? ? ? ? ? ? ?Anesthesia Quick Evaluation ? ?

## 2021-07-06 NOTE — Anesthesia Procedure Notes (Signed)
Epidural ?Patient location during procedure: OB ?Start time: 07/06/2021 3:13 PM ?End time: 07/06/2021 3:16 PM ? ?Staffing ?Anesthesiologist: Kaylyn Layer, MD ?Performed: anesthesiologist  ? ?Preanesthetic Checklist ?Completed: patient identified, IV checked, risks and benefits discussed, monitors and equipment checked, pre-op evaluation and timeout performed ? ?Epidural ?Patient position: sitting ?Prep: DuraPrep and site prepped and draped ?Patient monitoring: continuous pulse ox, blood pressure and heart rate ?Approach: midline ?Location: L3-L4 ?Injection technique: LOR air ? ?Needle:  ?Needle type: Tuohy  ?Needle gauge: 17 G ?Needle length: 9 cm ?Needle insertion depth: 5 cm ?Catheter type: closed end flexible ?Catheter size: 19 Gauge ?Catheter at skin depth: 10 cm ?Test dose: negative and Other (1% lidocaine) ? ?Assessment ?Events: blood not aspirated, injection not painful, no injection resistance, no paresthesia and negative IV test ? ?Additional Notes ?Patient identified. Risks, benefits, and alternatives discussed with patient including but not limited to bleeding, infection, nerve damage, paralysis, failed block, incomplete pain control, headache, blood pressure changes, nausea, vomiting, reactions to medication, itching, and postpartum back pain. Confirmed with bedside nurse the patient's most recent platelet count. Confirmed with patient that they are not currently taking any anticoagulation, have any bleeding history, or any family history of bleeding disorders. Patient expressed understanding and wished to proceed. All questions were answered. Sterile technique was used throughout the entire procedure. Please see nursing notes for vital signs.  ? ?Crisp LOR on first pass. Test dose was given through epidural catheter and negative prior to continuing to dose epidural or start infusion. Warning signs of high block given to the patient including shortness of breath, tingling/numbness in hands, complete  motor block, or any concerning symptoms with instructions to call for help. Patient was given instructions on fall risk and not to get out of bed. All questions and concerns addressed with instructions to call with any issues or inadequate analgesia.  Reason for block:procedure for pain ? ? ? ?

## 2021-07-06 NOTE — Progress Notes (Signed)
Kristen Grimes is a 31 y.o. G5P4004 at [redacted]w[redacted]d by LMPadmitted for vaginal bleeding, early labor. ? ?Subjective: ?Pt feeling mild/moderate contractions.   ? ?Objective: ?BP 103/64   Pulse 93   Temp 97.8 ?F (36.6 ?C) (Oral)   Resp 17   Ht 5\' 8"  (1.727 m)   Wt 85.3 kg   LMP 10/08/2020 (Exact Date)   SpO2 99%   BMI 28.59 kg/m?  ?No intake/output data recorded. ?No intake/output data recorded. ? ?FHT:  FHR: 135 bpm, variability: moderate,  accelerations:  Present,  decelerations:  Absent ?UC:   irregular, every 4-6 minutes ?SVE:   Dilation: 2.5 ?Effacement (%): 50 ?Station: Ballotable ?Exam by:: [email or web address], CNM ?Foley balloon placed without difficulty. Pt tolerated well. ? ?Scant bleeding on pad, brown in color.  ? ?Labs: ?Lab Results  ?Component Value Date  ? WBC 4.8 07/06/2021  ? HGB 11.1 (L) 07/06/2021  ? HCT 34.0 (L) 07/06/2021  ? MCV 88.1 07/06/2021  ? PLT 233 07/06/2021  ? ? ?Assessment / Plan: ?Induction of labor/augmentation of labor due to bright red vaginal bleeding in early labor at [redacted]w[redacted]d ?GBS positive  ? ?Labor:  FHR tracing is normal/reassuring and bleeding is scant at this time.Discussed options with pt, and given admission for bleeding, plan to begin augmentation on admission.   Pt agrees with plan of care.  Cervix favorable so foley balloon placed and Pitocin initiated at low dose, up to 6 m/u while catheter is in, titrate up to adequate labor when it is out. ?Preeclampsia:   n/a ?Fetal Wellbeing:  Category I ?Pain Control:  Labor support without medications ?I/D:   GBS positive, start PCN ?Anticipated MOD:  NSVD ? ?[redacted]w[redacted]d Leftwich-Kirby ?07/06/2021, 11:30 AM ? ? ?

## 2021-07-06 NOTE — Lactation Note (Signed)
This note was copied from a baby's chart. ?Lactation Consultation Note ? ?Patient Name: Boy Tura Humiston ?Today's Date: 07/06/2021 ?Reason for consult: Follow-up assessment;Term ?Age:29 hours ?P5, term female infant. ?LC entered the room, mom resting at this time, LC will attempt to see mom and infant later tonight will ask RN if mom would like to be seen by [facility] tonight  at her next RN assessment. ?Maternal Data ?  ? ?Feeding ?Lactation Tools Discussed/Used ?  ? ?Interventions ?Interventions: Breast feeding basics reviewed;Assisted with latch;Skin to skin;Breast compression;Adjust position;Support pillows;Position options ? ?Discharge ?  ? ?Consult Status ?Consult Status: Follow-up ?Date: 07/07/21 ?Follow-up type: In-patient ? ? ? ?Danelle Earthly ?07/06/2021, 9:45 PM ? ? ? ?

## 2021-07-07 ENCOUNTER — Encounter (HOSPITAL_COMMUNITY): Payer: Self-pay | Admitting: Family Medicine

## 2021-07-07 MED ORDER — METOCLOPRAMIDE HCL 10 MG PO TABS: 10.0000 mg | ORAL_TABLET | Freq: Once | ORAL | Status: DC

## 2021-07-07 MED ORDER — FAMOTIDINE 20 MG PO TABS: 40.0000 mg | ORAL_TABLET | Freq: Once | ORAL | Status: DC

## 2021-07-07 MED ORDER — LACTATED RINGERS IV SOLN: INTRAVENOUS | Status: DC

## 2021-07-07 NOTE — Lactation Note (Signed)
This note was copied from a baby's chart. ?Lactation Consultation Note ? ?Patient Name: Kristen Grimes ?Today's Date: 07/07/2021 ?Reason for consult: Follow-up assessment;Term ?Age:29 hours ? ?LC in to room for follow up. Parents report feeding ~50 mL of formula. Mother states infant keeps putting hand in his mouth even after a feeding. LC reviewed volume according to age. Encouraged upright position and frequent burping. Provided an extra slow flow nipple (purple).  ? ?Discussed normal infant behavior, clusterfeeding, normal output.  ? ?Feeding plan:  ?1-Feeding on demand or 8-12 times in 24h period  ?2-Encouraged maternal rest, hydration and food intake.  ? ?Contact LC as needed for feeds/support/concerns/questions. All questions answered at this time.   ? ?Maternal Data ?Does the patient have breastfeeding experience prior to this delivery?: Yes ?How long did the patient breastfeed?: combo feeding ? ?Feeding ?Mother's Current Feeding Choice: Breast Milk and Formula ?Nipple Type: Extra Slow Flow ? ?Lactation Tools Discussed/Used ?Tools: Pump ? ?Interventions ?Interventions: Pace feeding;Breast feeding basics reviewed;Skin to skin ? ?Discharge ?Pump: DEBP;Manual ?WIC Program: Yes ? ?Consult Status ?Consult Status: Follow-up ?Date: 07/08/21 ?Follow-up type: In-patient ? ? ? ?Betty Daidone A Higuera Ancidey ?07/07/2021, 6:33 PM ? ? ? ?

## 2021-07-07 NOTE — Anesthesia Postprocedure Evaluation (Signed)
Anesthesia Post Note ? ?Patient: Kristen Grimes ? ?Procedure(s) Performed: AN AD HOC LABOR EPIDURAL ? ?  ? ?Patient location during evaluation: Mother Baby ?Anesthesia Type: Epidural ?Level of consciousness: awake and alert ?Pain management: pain level controlled ?Vital Signs Assessment: post-procedure vital signs reviewed and stable ?Respiratory status: spontaneous breathing, nonlabored ventilation and respiratory function stable ?Cardiovascular status: stable ?Postop Assessment: no headache, no backache, epidural receding, no apparent nausea or vomiting, patient able to bend at knees, adequate PO intake and able to ambulate ?Anesthetic complications: no ? ? ?No notable events documented. ? ?Last Vitals:  ?Vitals:  ? 07/07/21 0118 07/07/21 0544  ?BP: 120/76 102/72  ?Pulse: 65 66  ?Resp: 18 18  ?Temp: 36.7 ?C 36.8 ?C  ?SpO2:  100%  ?  ?Last Pain:  ?Vitals:  ? 07/07/21 0738  ?TempSrc:   ?PainSc: 0-No pain  ? ?Pain Goal:   ? ?  ?  ?  ?  ?  ?  ?  ? ?Kristen Grimes ? ? ? ? ?

## 2021-07-07 NOTE — Progress Notes (Signed)
Post Partum Day 1 ?Subjective: ?No complaints, up ad lib, voiding, tolerating PO, and + flatus ?Baby is stable at bedside, formula feeding for now. Patient is trying to pump also. ? ?Objective: ?Blood pressure 114/71, pulse 69, temperature 98.2 ?F (36.8 ?C), temperature source Oral, resp. rate 16, height 5\' 8"  (1.727 m), weight 85.3 kg, last menstrual period 10/08/2020, SpO2 100 %, unknown if currently breastfeeding. ? ?Physical Exam:  ?General: alert and no distress ?Lochia: appropriate ?Uterine Fundus: firm ?DVT Evaluation: No evidence of DVT seen on physical exam. Negative Homan's sign. ?No cords or calf tenderness.No significant calf/ankle edema. ? ?Recent Labs  ?  07/06/21 ?0924  ?HGB 11.1*  ?HCT 34.0*  ? ? ?Assessment/Plan: ?- Breastfeeding, has been seen by Lactation consult ?- Plan for discharge tomorrow if remains stable. ?- Patient desires circumcision for her female infant.  Circumcision procedure details discussed, risks and benefits of procedure were also discussed.  These include but are not limited to: Benefits of circumcision in men include reduction in the rates of urinary tract infection (UTI), penile cancer, some sexually transmitted infections, penile inflammatory and retractile disorders, as well as easier hygiene.  Risks include bleeding , infection, injury of glans which may lead to penile deformity or urinary tract issues, unsatisfactory cosmetic appearance and other potential complications related to the procedure.  It was emphasized that this is an elective procedure.  Patient wants to proceed with circumcision; written informed consent obtained.  Circumcision will be done soon, routine circumcision and post circumcision care ordered for the infant. ?- For contraception, patient reported that she desires permanent sterilization. Medicaid papers had been signed on 05/14/21.  Other reversible forms of contraception including the most effective LARCs such as IUD or Nexplanon were discussed with  patient; she is seriously considering the progestin IUD. Her FOB also declined vasectomy, after being counseled about this procedure being less invasive and more effective.  Patient was also given the option of an interval laparoscopic tubal ligation or bilateral salpingectomy which slightly increases the efficacy and is less invasive but she declined.  Details of postpartum tubal sterilization discussed in detail.   She was told that this will be performed as either salpingectomy or occlusion with Filshie clips, depending on difficulty of procedure, exposure and other factors.  Risks of procedure discussed with patient including but not limited to: risk of regret, permanence of method, bleeding, infection, injury to surrounding organs and need for additional procedures.  Failure risk of about 1-2% with increased risk of ectopic gestation if pregnancy occurs was also discussed with patient.   Also discussed possibility of post-tubal syndrome with increased pelvic pain or menstrual irregularities. Patient verbalized understanding of these risks and wants to proceed with progestin IUD for now.  Discussed risks of irregular bleeding, cramping, infection, malpositioning or misplacement of the IUD outside the uterus which may require further procedure such as laparoscopy. Also discussed >99% contraception efficacy, increased risk of ectopic pregnancy with failure of method.   She still is interested in progestin IUD but may consider interval BTL.  But no procedure will be done today. She will follow up in office and likely get progestin IUD inserted at postpartum visit, message sent to [facility]. ?- Will continue close observation and postpartum care as ordered. Likely discharge to home tomorrow. ? ? LOS: 1 day  ? ?[facility], MD ?07/07/2021, 10:23 AM  ?

## 2021-07-08 ENCOUNTER — Other Ambulatory Visit (HOSPITAL_COMMUNITY): Payer: Self-pay

## 2021-07-08 MED ORDER — COCONUT OIL OIL: 1.0000 "application " | TOPICAL_OIL | 0 refills | Status: DC | PRN

## 2021-07-08 MED ORDER — ACETAMINOPHEN 325 MG PO TABS: 650.0000 mg | ORAL_TABLET | ORAL | Status: DC | PRN

## 2021-07-08 MED ORDER — IBUPROFEN 600 MG PO TABS
600.0000 mg | ORAL_TABLET | Freq: Four times a day (QID) | ORAL | 0 refills | Status: DC
  Filled 2021-07-08: qty 30, 8d supply, fill #0

## 2021-07-08 NOTE — Lactation Note (Signed)
This note was copied from a baby's chart. ?Lactation Consultation Note ? ?Patient Name: Kristen Grimes ?Today's Date: 07/08/2021 ?Reason for consult: Other (Comment) (giving all bottles) ?Age:29 hours ? ?Maternal Data ?  ? ?Feeding ?Nipple Type: Nfant Standard Flow (white) ? ?LATCH Score ?  ? ?  ? ?  ? ?  ? ?  ? ?  ? ? ?Lactation Tools Discussed/Used ?  ? ?Interventions ?  ? ?Discharge ?  ? ?Consult Status ?Consult Status: Complete ?Date: 07/08/21 ? ? ? ?Kristen Grimes ?07/08/2021, 11:07 AM ? ? ? ?

## 2021-07-09 ENCOUNTER — Inpatient Hospital Stay (HOSPITAL_COMMUNITY): Payer: Medicaid Other

## 2021-07-09 ENCOUNTER — Inpatient Hospital Stay (HOSPITAL_COMMUNITY)
Admission: AD | Admit: 2021-07-09 | Payer: Medicaid Other | Source: Home / Self Care | Admitting: Obstetrics & Gynecology

## 2021-07-11 ENCOUNTER — Encounter: Payer: Medicaid Other | Admitting: Obstetrics

## 2021-07-16 ENCOUNTER — Telehealth (HOSPITAL_COMMUNITY): Payer: Self-pay | Admitting: *Deleted

## 2021-07-16 NOTE — Telephone Encounter (Addendum)
Mom reports feeling good. No concerns about herself at this time. EPDS=6 [facility] score=0) ?Mom reports baby is doing well. Feeding, peeing, and pooping without difficulty. Safe sleep reviewed. Mom reports no concerns about baby at present. ? ?Duffy Rhody, RN 07-16-2021 at 9:13am ?

## 2021-08-14 ENCOUNTER — Ambulatory Visit (INDEPENDENT_AMBULATORY_CARE_PROVIDER_SITE_OTHER): Payer: Medicaid Other | Admitting: Women's Health

## 2021-08-14 NOTE — Progress Notes (Signed)
Post Partum Visit Note  Kristen Grimes is a 31 y.o. [id number] female who presents for a postpartum visit. She is 4 week postpartum following a normal spontaneous vaginal delivery.  I have fully reviewed the prenatal and intrapartum course. The delivery was at 39 gestational weeks.  Anesthesia: epidural. Postpartum course has been uncomplicated. Baby is doing well yes. Baby is feeding by bottle - Gentle soy . Bleeding staining only. Bowel function is normal. Bladder function is normal. Patient is not sexually active. Contraception method is abstinence. Postpartum depression screening: negative.   The pregnancy intention screening data noted above was reviewed. Potential methods of contraception were discussed. The patient elected to proceed with No data recorded.   Edinburgh Postnatal Depression Scale - 08/14/21 1603       Edinburgh Postnatal Depression Scale:  In the Past 7 Days   I have been able to laugh and see the funny side of things. 0    I have looked forward with enjoyment to things. 0    I have blamed myself unnecessarily when things went wrong. 0    I have been anxious or worried for no good reason. 0    I have felt scared or panicky for no good reason. 0    Things have been getting on top of me. 2    I have been so unhappy that I have had difficulty sleeping. 2    I have felt sad or miserable. 2    I have been so unhappy that I have been crying. 1    The thought of harming myself has occurred to me. 0    Edinburgh Postnatal Depression Scale Total 7           Patient states she feels like herself, just getting used to having 5 kids. Patient tearful when discussing, but just states she is overwhelmed. She does feel supported sometimes and does have family in the area which she likes and is supported by. Patient states she does not have any friends.  There are no preventive care reminders to display for this patient.  The following portions of the patient's history were  reviewed and updated as appropriate: allergies, current medications, past family history, past medical history, past social history, past surgical history, and problem list.  Review of Systems Pertinent items noted in HPI and remainder of comprehensive ROS otherwise negative.  Objective:  BP 123/73   Pulse 80   Ht 5\' 8"  (1.727 m)   Wt 179 lb 3.2 oz (81.3 kg)   LMP 08/07/2021 (Exact Date)   Breastfeeding No   BMI 27.25 kg/m    General:  alert, cooperative, and no distress   Breasts:  not indicated  Lungs: clear to auscultation bilaterally  Heart:  regular rate and rhythm, S1, S2 normal, no murmur, click, rub or gallop  Abdomen: soft, non-tender; bowel sounds normal; no masses,  no organomegaly   Wound N/a  GU exam:  not indicated       Assessment:   1. Postpartum exam -normal -pt unable to void for UPT, will return for Nexplanon insertion, pt states OK to abstain from intercourse until insertion.  Normal postpartum exam.   Plan:   Essential components of care per ACOG recommendations:  1.  Mood and well being: Patient with negative depression screening today. Reviewed local resources for support.  - Patient tobacco use? No.   - hx of drug use? No.    2. Infant care and feeding:  -Patient  currently breastmilk feeding? No.  -Social determinants of health (SDOH) reviewed in EPIC.  3. Sexuality, contraception and birth spacing - Patient does not want a pregnancy in the next year.  Desired family size is 5 children.  - Reviewed reproductive life planning. Reviewed contraceptive methods based on pt preferences and effectiveness.  Patient desired Hormonal Implant today.   - Discussed birth spacing of 18 months  4. Sleep and fatigue -Encouraged family/partner/community support of 4 hrs of uninterrupted sleep to help with mood and fatigue  5. Physical Recovery  - Discussed patients delivery and complications. She describes her labor as good. - Patient had a Vaginal, no  problems at delivery. Patient had a  no  laceration. Perineal healing reviewed. Patient expressed understanding - Patient has urinary incontinence? No. - Patient is safe to resume physical and sexual activity  6.  Health Maintenance - HM due items addressed Yes - Last pap smear  Diagnosis  Date Value Ref Range Status  01/17/2021   Final   - Negative for intraepithelial lesion or malignancy (NILM)   Pap smear not done at today's visit.  -Breast Cancer screening indicated? No.   7. Chronic Disease/Pregnancy Condition follow up: None  - PCP follow up  Marylen Ponto, NP Center for [employer], [facility]

## 2021-08-14 NOTE — Patient Instructions (Addendum)
AREA FAMILY PRACTICE PHYSICIANS  [place] ([id number]) [facility] [address] [place], [place] [address] [phone] Mon-Fri 8:30-12:30, 1:30-5:00 Accepting Medicaid [facility] [address], [place], [place] [address] [phone] Mon-Fri 8:00-5:30 [facility] [address] [place], [place] [address] [phone] Mon, Tue, Thur, Fri 8:30-5:00, Wed 10:00-7:00 (closed 1-2pm) Accepting Medicaid [facility] [address], [place], [place]  [address] Phone - [phone]   Fax - [phone]  [place] ([address]) [facility] [address] [place], [place] [address] [phone] Mon-Fri 8:00-5:00 [facility] ([facility])  [address] [place], [place] [address] [phone] Mon-Fri 8:30-5:30, Sat (Oct.-Mar.) 9:00-1:00 Accepting Medicaid  [place] ([address]) [facility] [address], [place], [place] [address] [phone] Mon-Fri 8:00-5:00  [place] ([address]) [facility] [address], [place], [place] [address] [phone] Mon-Fri 8:00-5:00 [facility] [address], [place], [place] [address] [phone] Mon-Fri 8:00-5:00 [facility] [address] [place], [place] [address] [phone] Mon-Fri 8:00-5:00 [facility] [address] [place] [address] [phone] Mon-Fri 7:30-5:30  [place] ([address] & [address]) [facility] [address] [place], [place] [address] [phone] Mon-Thur 8:00-6:00 Accepting Medicaid [facility] [address] [place], [place] [address] [phone] Mon-Thur 7:30-7:30, Fri 7:30-4:30 Accepting  Medicaid [facility] [address], [place], [place]  [address] [phone]   Fax - [phone]  [place] ([address] & [address]) [facility] [address] [place], [place] [address] [phone] Mon-Fri 7:00-5:00 [facility] [address], [place], [place] [address] [phone] Mon-Fri 8:00-5:00 Accepting Medicaid [facility] [address], , [place] [address] [phone] Mon-Fri 8:00-5:00 Accepting Medicaid  [place] ([address]) [facility] [place] [address] [place], [place] [address] [phone] Mon-Fri 8:00-5:00 [facility] [address], [place], [place] [address] [phone] Mon-Fri 8:00-5:00 Accepting Medicaid [facility] ([facility]) [address], [place], [place] [address] [phone] Mon-Fri 8:00-5:30, Sat&Sun by appointment (phones open at 8:30) Accepting Medicaid  [place] ([address]) [facility] [address] [place], [place] [address] [phone] Mon-Thur 8:00-7:00, Fri 8:00-5:00, Sat 8:00-12:00, Sun 9:00-12:00 Accepting Medicaid [facility] [place] [address] [place] [address], [place], [place] [address] [phone] Mon-Thur 8:00-5:00 Accepting Medicaid [facility] [address] [place], [place] [address] [phone] Mon-Fri 8:00-5:30, Sat (Oct.-Mar.) 9:00-1:00 Accepting Medicaid  [place] ([address]) [facility] [address], [place], [place] [address] [phone] Mon-Fri 8:00-5:00 Accepting Medicaid   [place] ([address]) [facility] [place] [address], [place], [place] [address] [phone] Mon-Fri 8:00-5:00 [facility] [place] [address], [place], [place] [address] [phone] Mon-Fri 8:00-5:00 [facility] [place] [address] [place] [address], [place], [place] [address] [phone] Mon-Fri 8:00-5:00 After hours clinic ([address] [place],  [address]) [phone] Mon-Fri 5:00-8:00, Sat 12:00-6:00, Sun 10:00-4:00 Accepting Medicaid [facility] [place]   [address], [place], [place]  [address] [phone]   Fax - [phone]  [place] ([address]) [facility] [place] [facility] [address], [place], [place] [address] [phone] Mon-Fri 8:00-5:00 [facility] [place] ([facility] [place]) [address], [place], [place] [address] [phone] Mon-Thur 8:00-7:00, Fri 8:00-5:00, Sat 8:00-12:00    

## 2021-10-14 ENCOUNTER — Other Ambulatory Visit: Payer: Self-pay | Admitting: Obstetrics & Gynecology

## 2021-10-14 ENCOUNTER — Telehealth: Payer: Self-pay

## 2021-10-14 DIAGNOSIS — Z30016 Encounter for initial prescription of transdermal patch hormonal contraceptive device: Secondary | ICD-10-CM

## 2021-10-14 MED ORDER — XULANE 150-35 MCG/24HR TD PTWK: 1.0000 | MEDICATED_PATCH | TRANSDERMAL | 12 refills | Status: DC

## 2021-10-14 NOTE — Telephone Encounter (Signed)
Returned call, and advised of pharmacy that [facility] patch was sent to, pt states that she will pick up from there.

## 2021-10-14 NOTE — Progress Notes (Signed)
Patient called and wanted contraceptive patch. Currently on her menstrual period.   On chart review, no contraindications to patch noted. She was prescribed Xulane, will be given precautions about irregular bleeding and other side effects to watch out for.  Will follow up in 2 months for BP check.   Jaynie Collins, MD, FACOG Obstetrician & Gynecologist, [facility] for [employer], [facility]

## 2021-12-02 IMAGING — US US MFM OB DETAIL+14 WK
1 series · 13 of 28 positions shown · non-contrast
Comparison: none

[Series 1: us mfm ob detail+14 wk · 108 acquisitions, 13 frames shown]
[im 4/108]
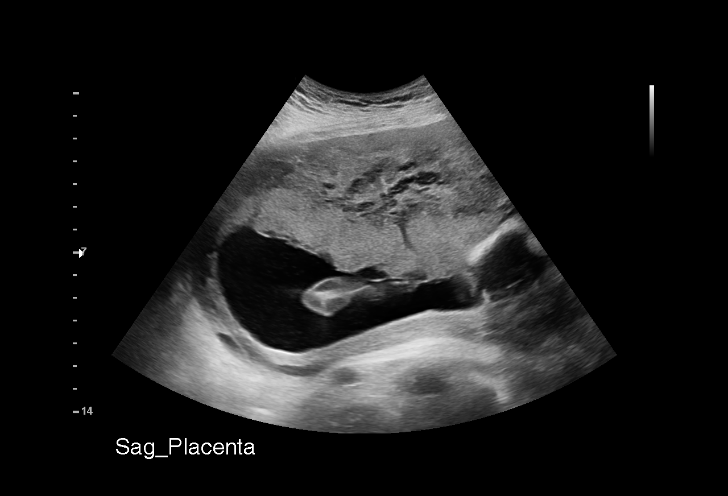
[im 12/108]
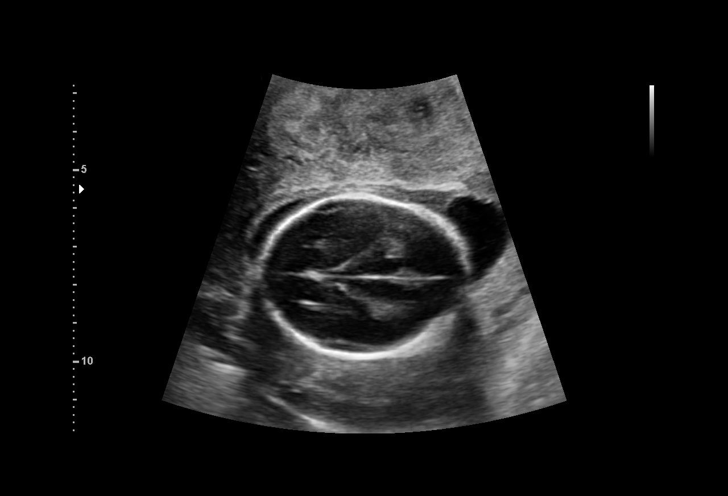
[im 20/108]
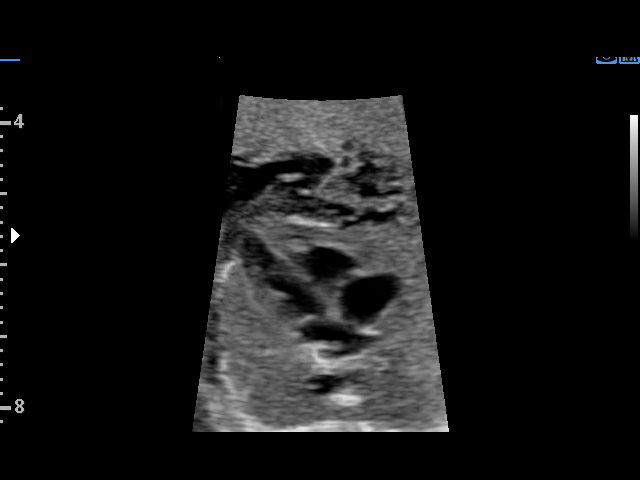
[im 28/108]
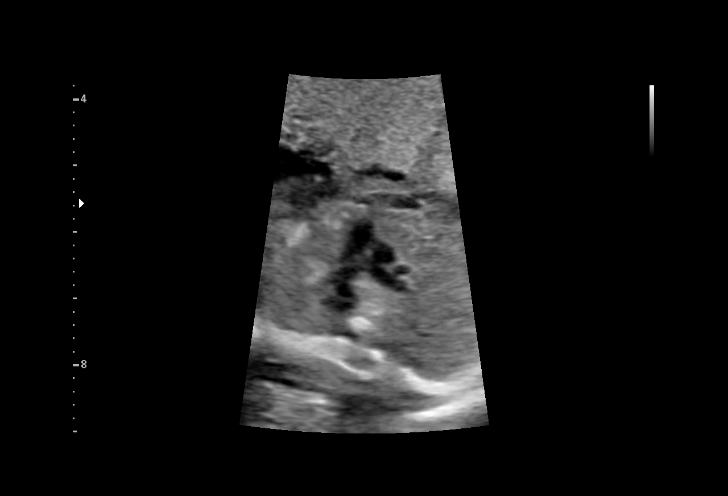
[im 36/108]
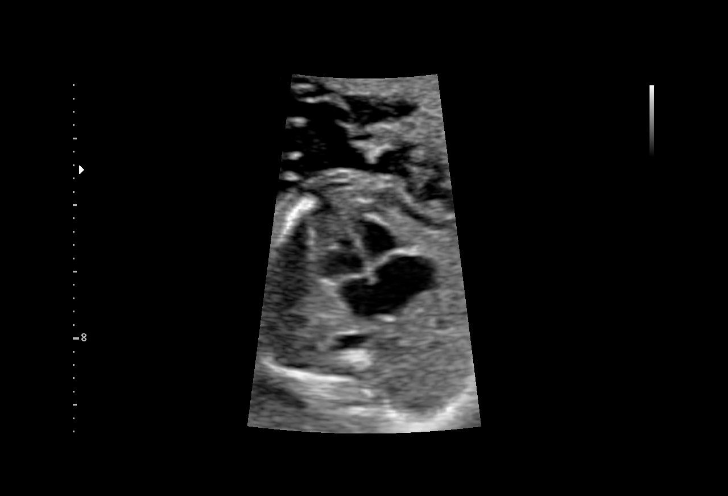
[im 44/108]
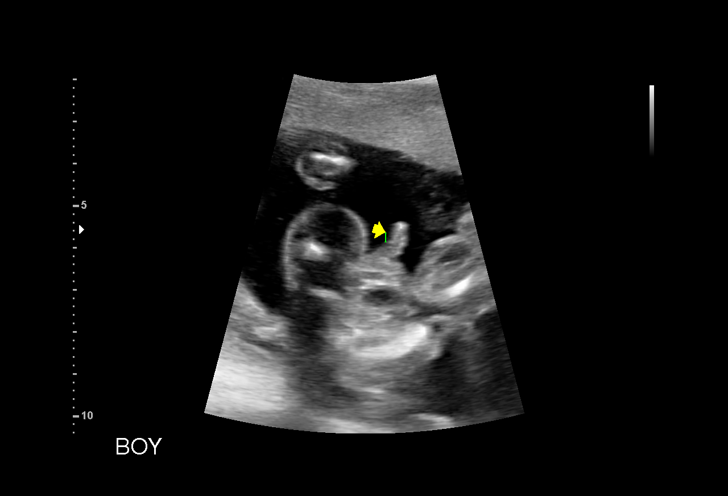
[im 56/108]
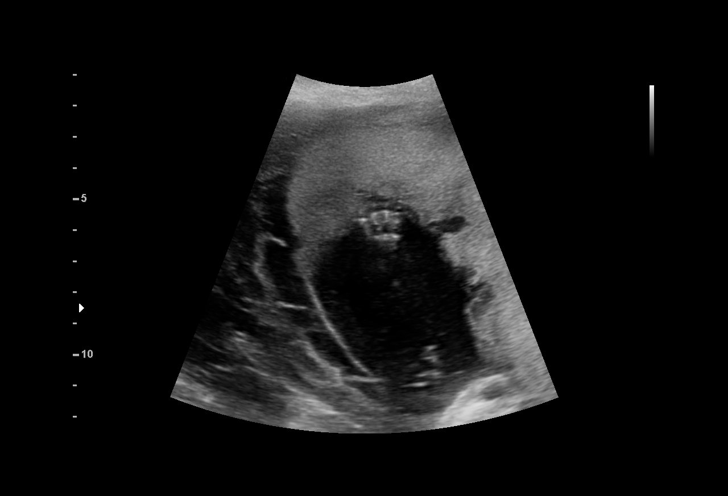
[im 64/108]
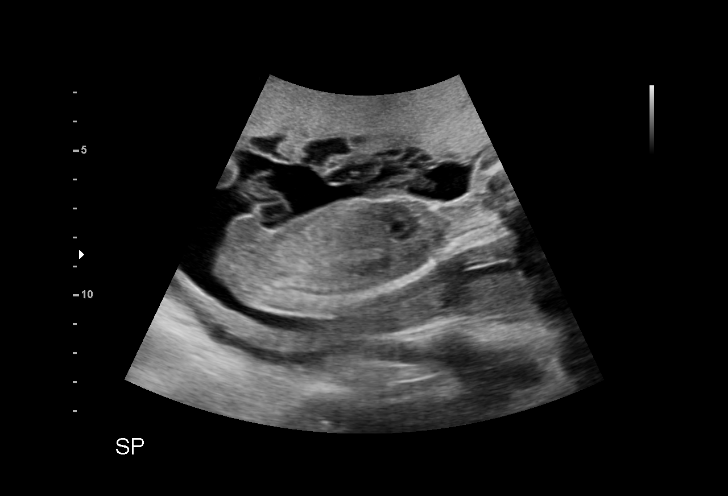
[im 72/108]
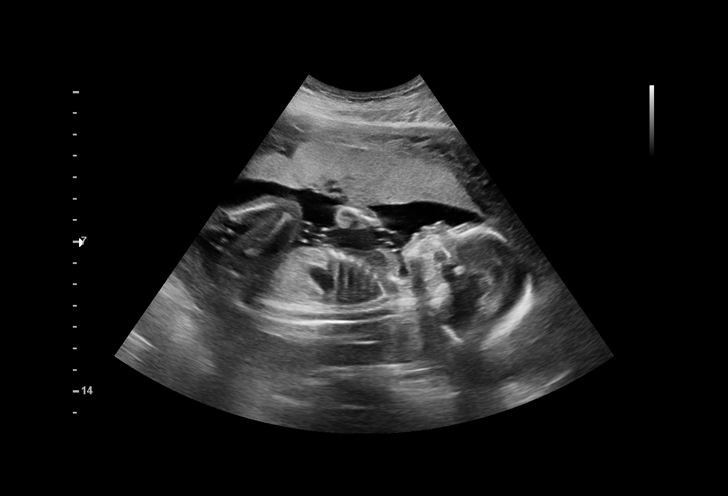
[im 80/108]
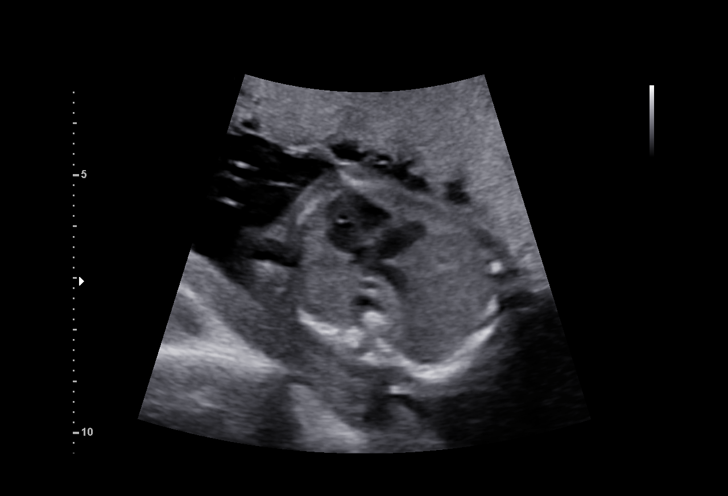
[im 88/108]
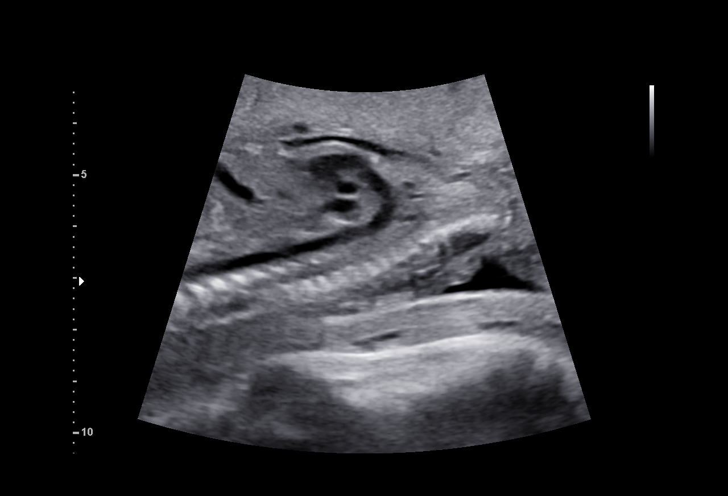
[im 96/108]
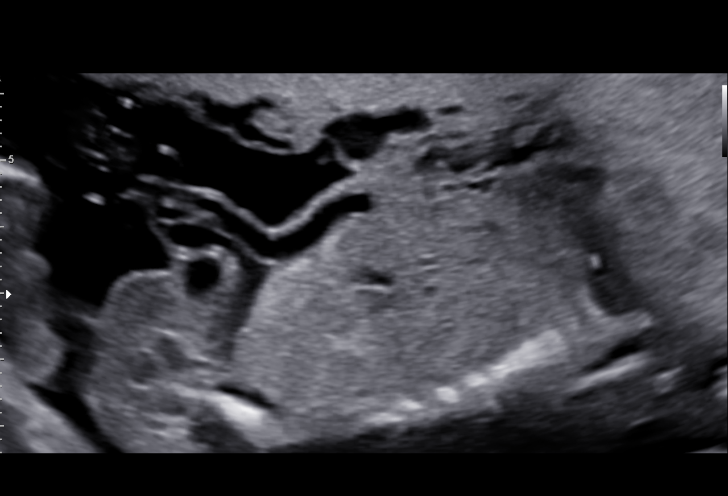
[im 104/108]
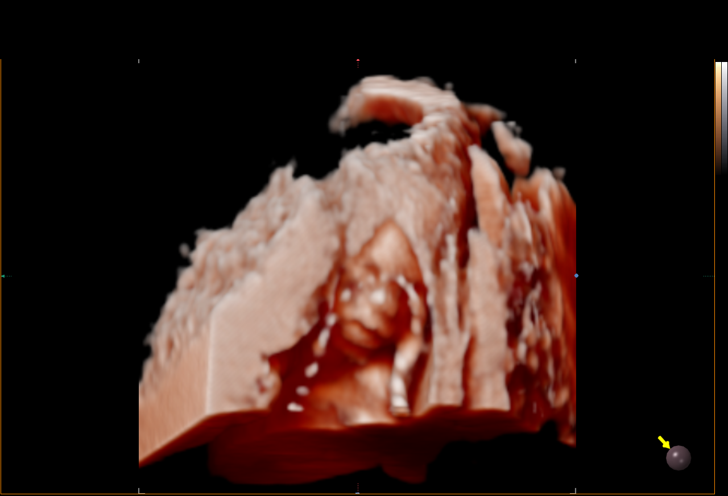

[13 of 28 positions shown; findings below may reference images not displayed]

Indications

 Echogenic intracardiac focus of the heart
 (EIF)
 Encounter for antenatal screening for
 malformations
 Short interval between pregancies, 2nd
 trimester
 LR NIPS
 19 weeks gestation of pregnancy
Fetal Evaluation

 Num Of Fetuses:         1
 Fetal Heart Rate(bpm):  147
 Cardiac Activity:       Observed
 Presentation:           Cephalic
 Placenta:               Anterior
 P. Cord Insertion:      Visualized, central

 Amniotic Fluid
 AFI FV:      Within normal limits

                             Largest Pocket(cm)

Biometry

 BPD:      41.3  mm     G. Age:  18w 4d         29  %    CI:        68.64   %    70 - 86
                                                         FL/HC:      19.0   %    16.1 -
 HC:      159.3  mm     G. Age:  18w 5d         31  %    HC/AC:      1.11        1.09 -
 AC:      143.4  mm     G. Age:  19w 5d         69  %    FL/BPD:     73.1   %
 FL:       30.2  mm     G. Age:  19w 2d         56  %    FL/AC:      21.1   %    20 - 24
 HUM:      29.4  mm     G. Age:  19w 4d         66  %
 CER:      19.6  mm     G. Age:  19w 0d         36  %
 NFT:       4.6  mm

 LV:        4.6  mm
 CM:        3.7  mm

 Est. FW:     291  gm    0 lb 10 oz      70  %
OB History

 Gravidity:    5         Term:   4
 Living:       4
Gestational Age

 LMP:           19w 0d        Date:  10/02/20                 EDD:   07/09/21
 U/S Today:     19w 1d                                        EDD:   07/08/21
 Best:          19w 0d     Det. By:  LMP  (10/02/20)          EDD:   07/09/21
Anatomy

 Cranium:               Appears normal         Aortic Arch:            Appears normal
 Cavum:                 Appears normal         Ductal Arch:            Appears normal
 Ventricles:            Appears normal         Diaphragm:              Appears normal
 Choroid Plexus:        Appears normal         Stomach:                Appears normal, left
                                                                       sided
 Cerebellum:            Appears normal         Abdomen:                Appears normal
 Posterior Fossa:       Appears normal         Abdominal Wall:         Appears nml (cord
                                                                       insert, abd wall)
 Nuchal Fold:           Appears normal         Cord Vessels:           Appears normal (3
                                                                       vessel cord)
 Face:                  Appears normal         Kidneys:                Appear normal
                        (orbits and profile)
 Lips:                  Appears normal         Bladder:                Appears normal
 Thoracic:              Appears normal         Spine:                  Ltd views no
                                                                       intracranial signs of
                                                                       NTD
 Heart:                 Appears normal EIF     Upper Extremities:      Appears normal
 RVOT:                  Appears normal         Lower Extremities:      Appears normal
 LVOT:                  Appears normal

 Other:  Fetus appears to be a male. VC, 3VV and 3VTV visualized. Heels
         and 5th digit visualized.
Cervix Uterus Adnexa

 Cervix
 Length:           3.81  cm.
 Normal appearance by transabdominal scan.

 Right Ovary
 Visualized.

 Left Ovary
 Visualized.
Comments

 This patient was seen for a detailed fetal anatomy scan.
 She denies any significant past medical history and denies
 any problems in her current pregnancy.
 She had a cell free DNA test earlier in her pregnancy which
 indicated a low risk for trisomy 21, 18, and 13. A male fetus is
 predicted.
 She was informed that the fetal growth and amniotic fluid
 level were appropriate for her gestational age.
 On today's exam, an intracardiac echogenic focus was noted
 in the left ventricle of the fetal heart.  The small association
 between an echogenic focus and Down syndrome was
 discussed. Due to the echogenic focus noted today, the
 patient was offered and declined an amniocentesis today for
 definitive diagnosis of fetal aneuploidy.  She reports that she
 is comfortable with her negative cell free DNA test.
 The patient was informed that anomalies may be missed due
 to technical limitations. If the fetus is in a suboptimal position
 or maternal habitus is increased, visualization of the fetus in
 the maternal uterus may be impaired.
 A follow-up exam was scheduled in 4 weeks to reassess the
 views of the fetal anatomy which were limited today due to
 the fetal position.

## 2021-12-13 ENCOUNTER — Ambulatory Visit (INDEPENDENT_AMBULATORY_CARE_PROVIDER_SITE_OTHER): Payer: Medicaid Other

## 2021-12-13 DIAGNOSIS — Z0130 Encounter for examination of blood pressure without abnormal findings: Secondary | ICD-10-CM

## 2021-12-13 NOTE — Progress Notes (Signed)
Subjective:  Kristen Grimes is a 31 y.o. female here for BP check.   Hypertension ROS: no chest pain on exertion, no dyspnea on exertion, no swelling of ankles, and no orthostatic dizziness or lightheadedness.    Objective:  Appearance alert, well appearing, and in no distress. General exam BP noted to be well controlled today in office.    Assessment:   Blood Pressure within normal limits. Patient reports no side effects .   Plan:  Follow-up as needed.

## 2021-12-20 DIAGNOSIS — Z1152 Encounter for screening for COVID-19: Secondary | ICD-10-CM | POA: Diagnosis not present

## 2022-04-16 ENCOUNTER — Telehealth: Payer: Self-pay

## 2022-04-16 ENCOUNTER — Encounter: Payer: Self-pay | Admitting: Obstetrics

## 2022-04-16 NOTE — Telephone Encounter (Signed)
Pt complains of breast pain, worse when she is laying down, and with breathing in. Advised scheduler will contact

## 2022-04-17 ENCOUNTER — Ambulatory Visit: Payer: Medicaid Other | Admitting: Obstetrics

## 2022-04-22 ENCOUNTER — Encounter (HOSPITAL_COMMUNITY): Payer: Self-pay

## 2022-04-22 ENCOUNTER — Ambulatory Visit (HOSPITAL_COMMUNITY)
Admission: EM | Admit: 2022-04-22 | Discharge: 2022-04-22 | Disposition: A | Discharge status: Home / Self Care | Payer: Medicaid Other | Attending: Physician Assistant | Admitting: Physician Assistant

## 2022-04-22 DIAGNOSIS — J069 Acute upper respiratory infection, unspecified: Secondary | ICD-10-CM

## 2022-04-22 DIAGNOSIS — Z1152 Encounter for screening for COVID-19: Secondary | ICD-10-CM | POA: Insufficient documentation

## 2022-04-22 DIAGNOSIS — R059 Cough, unspecified: Secondary | ICD-10-CM | POA: Diagnosis present

## 2022-04-22 DIAGNOSIS — Z28310 Unvaccinated for covid-19: Secondary | ICD-10-CM | POA: Insufficient documentation

## 2022-04-22 DIAGNOSIS — H9209 Otalgia, unspecified ear: Secondary | ICD-10-CM | POA: Diagnosis not present

## 2022-04-22 DIAGNOSIS — R051 Acute cough: Secondary | ICD-10-CM | POA: Diagnosis not present

## 2022-04-22 LAB — POC INFLUENZA A AND B ANTIGEN (URGENT CARE ONLY)
Influenza A Ag: NEGATIVE
Influenza B Ag: NEGATIVE

## 2022-04-22 LAB — SARS CORONAVIRUS 2 (TAT 6-24 HRS): SARS Coronavirus 2: NEGATIVE

## 2022-04-22 MED ORDER — FLUTICASONE PROPIONATE 50 MCG/ACT NA SUSP: 1.0000 | Freq: Every day | NASAL | 0 refills | Status: DC

## 2022-04-22 MED ORDER — PROMETHAZINE-DM 6.25-15 MG/5ML PO SYRP: 5.0000 mL | ORAL_SOLUTION | Freq: Three times a day (TID) | ORAL | 0 refills | Status: DC | PRN

## 2022-04-22 NOTE — ED Provider Notes (Signed)
[facility]    CSN: [id number] Arrival date & time: 04/22/22  1001      History   Chief Complaint Chief Complaint  Patient presents with   Cough   Sore Throat   Otalgia    HPI Tejasvi Brissett is a 32 y.o. female.   Patient presents today with a 2-day history of URI symptoms including congestion, cough, body aches, otalgia.  Denies any chest pain, shortness of breath, nausea, vomiting, diarrhea.  Denies any known sick contacts but does work around many people.  She has not had COVID in the past.  She has not had a COVID-19 vaccination.  She has tried over-the-counter multisymptom medication without improvement of symptoms.  Denies any history of asthma, COPD, smoking, seasonal allergies.  She is confident that she is not pregnant.  Denies any recent antibiotics or steroids.  She is eating and drinking normally.    Past Medical History:  Diagnosis Date   Anemia affecting pregnancy, antepartum 01/27/2018   GBS (group B Streptococcus carrier), +RV culture, currently pregnant 07/25/2018   Gonorrhea affecting pregnancy 07/25/2018   Group B streptococcal carriage complicating pregnancy 3/71/0626   Medical history non-contributory     Patient Active Problem List   Diagnosis Date Noted   Group B streptococcus urinary tract infection affecting pregnancy 05/23/2021   [place] interval between pregnancies affecting pregnancy, antepartum 07/13/2019   Supervision of other normal pregnancy, antepartum 07/04/2019    Past Surgical History:  Procedure Laterality Date   NO PAST SURGERIES      OB History     Gravida  5   Para  5   Term  5   Preterm      AB      Living  5      SAB      IAB      Ectopic      Multiple  0   Live Births  5            Home Medications    Prior to Admission medications   Medication Sig Start Date End Date Taking? Authorizing Provider  fluticasone (FLONASE) 50 MCG/ACT nasal spray Place 1 spray into both nostrils daily. 04/22/22   Yes Jaxsyn Catalfamo, Derry Skill, PA-C  norelgestromin-ethinyl estradiol Marilu Favre) 150-35 MCG/24HR transdermal patch Place 1 patch onto the skin once a week. 10/14/21  Yes Anyanwu, Sallyanne Havers, MD  promethazine-dextromethorphan (PROMETHAZINE-DM) 6.25-15 MG/5ML syrup Take 5 mLs by mouth 3 (three) times daily as needed for cough. 04/22/22  Yes Clotile Whittington, Derry Skill, PA-C  acetaminophen (TYLENOL) 325 MG tablet Take 2 tablets (650 mg total) by mouth every 4 (four) hours as needed (for pain scale < 4). Patient not taking: Reported on 12/13/2021 07/08/21   Lenoria Chime, MD  coconut oil OIL Apply 1 application. topically as needed. Patient not taking: Reported on 12/13/2021 07/08/21   Lenoria Chime, MD  ibuprofen (ADVIL) 600 MG tablet Take 1 tablet (600 mg total) by mouth every 6 (six) hours. Patient not taking: Reported on 12/13/2021 07/08/21   Lenoria Chime, MD  pantoprazole (PROTONIX) 40 MG tablet Take 1 tablet (40 mg total) by mouth daily. Patient not taking: Reported on 12/13/2021 05/28/21 05/28/22  Woodroe Mode, MD    Family History Family History  Problem Relation Age of Onset   Multiple sclerosis Mother     Social History Social History   Tobacco Use   Smoking status: Never   Smokeless tobacco: Never  Vaping Use  Vaping Use: Never used  Substance Use Topics   Alcohol use: Not Currently    Comment: occ wine- stop with pregnancy   Drug use: No     Allergies   Patient has no known allergies.   Review of Systems Review of Systems  Constitutional:  Positive for activity change and fatigue. Negative for appetite change and fever.  HENT:  Positive for congestion, ear pain, sinus pressure and sore throat. Negative for sneezing.   Respiratory:  Positive for cough. Negative for shortness of breath.   Cardiovascular:  Negative for chest pain.  Gastrointestinal:  Negative for abdominal pain, diarrhea, nausea and vomiting.  Musculoskeletal:  Positive for arthralgias and myalgias.  Neurological:  Negative  for dizziness, light-headedness and headaches.     Physical Exam Triage Vital Signs ED Triage Vitals  Enc Vitals Group     BP 04/22/22 1137 116/76     Pulse Rate 04/22/22 1137 72     Resp 04/22/22 1137 12     Temp 04/22/22 1137 98 F (36.7 C)     Temp Source 04/22/22 1137 Oral     SpO2 04/22/22 1137 97 %     Weight --      Height --      Head Circumference --      Peak Flow --      Pain Score 04/22/22 1141 8     Pain Loc --      Pain Edu? --      Excl. in [place]? --    No data found.  Updated Vital Signs BP 116/76 (BP Location: Right Arm)   Pulse 72   Temp 98 F (36.7 C) (Oral)   Resp 12   LMP 04/12/2022   SpO2 97%   Visual Acuity Right Eye Distance:   Left Eye Distance:   Bilateral Distance:    Right Eye Near:   Left Eye Near:    Bilateral Near:     Physical Exam Vitals reviewed.  Constitutional:      General: She is awake. She is not in acute distress.    Appearance: Normal appearance. She is well-developed. She is not ill-appearing.     Comments: Very pleasant female appears stated age in no acute distress sitting comfortably in exam room  HENT:     Head: Normocephalic and atraumatic.     Right Ear: Ear canal and external ear normal. A middle ear effusion is present. Tympanic membrane is not erythematous or bulging.     Left Ear: Ear canal and external ear normal. A middle ear effusion is present. Tympanic membrane is not erythematous or bulging.     Nose:     Right Sinus: No maxillary sinus tenderness or frontal sinus tenderness.     Left Sinus: No maxillary sinus tenderness or frontal sinus tenderness.     Mouth/Throat:     Pharynx: Uvula midline. Posterior oropharyngeal erythema present. No oropharyngeal exudate.  Cardiovascular:     Rate and Rhythm: Normal rate and regular rhythm.     Heart sounds: Normal heart sounds, S1 normal and S2 normal. No murmur heard. Pulmonary:     Effort: Pulmonary effort is normal.     Breath sounds: Normal breath sounds.  No wheezing, rhonchi or rales.     Comments: Clear to auscultation bilaterally Psychiatric:        Behavior: Behavior is cooperative.      UC Treatments / Results  Labs (all labs ordered are listed, but only abnormal results  are displayed) Labs Reviewed  SARS CORONAVIRUS 2 (TAT 6-24 HRS)  POC INFLUENZA A AND B ANTIGEN (URGENT CARE ONLY)    EKG   Radiology No results found.  Procedures Procedures (including critical care time)  Medications Ordered in UC Medications - No data to display  Initial Impression / Assessment and Plan / UC Course  I have reviewed the triage vital signs and the nursing notes.  Pertinent labs & imaging results that were available during my care of the patient were reviewed by me and considered in my medical decision making (see chart for details).     Patient is well-appearing, afebrile, nontoxic, nontachycardic.  No evidence of acute infection on physical exam that warrant initiation of antibiotics.  Suspect viral etiology.  Flu testing was negative.  COVID testing is pending.  Patient is young and otherwise healthy so not a candidate for antiviral therapy.  Will treat symptomatically with Promethazine DM.  Discussed that this can be sedating and she is not to drive or drink alcohol with taking it.  Also recommended over-the-counter medications to help with congestion including Mucinex and Flonase.  She can alternate Tylenol and ibuprofen for additional symptom relief.  Recommended that she rest and drink plenty of fluid.  Discussed that if her symptoms are not improving within a week she is to return for reevaluation.  If she has any worsening symptoms including high fever not responding to medication, chest pain, shortness of breath, nausea/vomiting interfering with oral intake, weakness, lethargy she should be seen immediately.  Strict return precautions given.  Work excuse note with current CDC return to work guidelines provided during visit  today.  Final Clinical Impressions(s) / UC Diagnoses   Final diagnoses:  Upper respiratory tract infection, unspecified type  Acute cough     Discharge Instructions      You are negative for flu.  We will contact you if you are positive for COVID.  Please monitor your MyChart for these results.  Use Promethazine DM for cough.  This will make you sleepy so do not drive or drink alcohol with taking it.  Use over-the-counter medication such as Tylenol and ibuprofen.  Use Flonase to help with congestion.  Make sure that you rest and drink plenty of fluid.  If your symptoms are not improving within a week return for reevaluation.  If anything worsens and you have chest pain, shortness of breath, fever not respond to medication, weakness, nausea/vomiting interfering with oral intake you should be seen immediately.     ED Prescriptions     Medication Sig Dispense Auth. Provider   promethazine-dextromethorphan (PROMETHAZINE-DM) 6.25-15 MG/5ML syrup Take 5 mLs by mouth 3 (three) times daily as needed for cough. 118 mL Sasha Rogel K, PA-C   fluticasone (FLONASE) 50 MCG/ACT nasal spray Place 1 spray into both nostrils daily. 16 g Calie Buttrey K, PA-C      PDMP not reviewed this encounter.   Terrilee Croak, PA-C 04/22/22 1221

## 2022-04-22 NOTE — Discharge Instructions (Signed)
You are negative for flu.  We will contact you if you are positive for COVID.  Please monitor your MyChart for these results.  Use Promethazine DM for cough.  This will make you sleepy so do not drive or drink alcohol with taking it.  Use over-the-counter medication such as Tylenol and ibuprofen.  Use Flonase to help with congestion.  Make sure that you rest and drink plenty of fluid.  If your symptoms are not improving within a week return for reevaluation.  If anything worsens and you have chest pain, shortness of breath, fever not respond to medication, weakness, nausea/vomiting interfering with oral intake you should be seen immediately.

## 2022-04-22 NOTE — ED Triage Notes (Signed)
Pt is here for cough, runny nose, congestion, chest congestion, sore throat, bilateral ear pain , back pain, x 2days

## 2023-01-23 ENCOUNTER — Other Ambulatory Visit: Payer: Self-pay | Admitting: Obstetrics & Gynecology

## 2023-01-23 DIAGNOSIS — Z30016 Encounter for initial prescription of transdermal patch hormonal contraceptive device: Secondary | ICD-10-CM

## 2023-03-15 ENCOUNTER — Encounter (HOSPITAL_COMMUNITY): Payer: Self-pay | Admitting: Emergency Medicine

## 2023-03-15 ENCOUNTER — Other Ambulatory Visit: Payer: Self-pay

## 2023-03-15 ENCOUNTER — Ambulatory Visit (HOSPITAL_COMMUNITY)
Admission: EM | Admit: 2023-03-15 | Discharge: 2023-03-15 | Disposition: A | Discharge status: Home / Self Care | Payer: Medicaid Other | Attending: Emergency Medicine | Admitting: Emergency Medicine

## 2023-03-15 DIAGNOSIS — K2900 Acute gastritis without bleeding: Secondary | ICD-10-CM | POA: Diagnosis not present

## 2023-03-15 LAB — POC COVID19/FLU A&B COMBO
Covid Antigen, POC: NEGATIVE
Influenza A Antigen, POC: NEGATIVE
Influenza B Antigen, POC: NEGATIVE

## 2023-03-15 MED ORDER — ALUM & MAG HYDROXIDE-SIMETH 200-200-20 MG/5ML PO SUSP
30.0000 mL | Freq: Once | ORAL | Status: AC
  Administered 2023-03-15: 30 mL via ORAL

## 2023-03-15 MED ORDER — LIDOCAINE VISCOUS HCL 2 % MT SOLN
OROMUCOSAL | Status: AC
Start: 2023-03-15 — End: ?
  Filled 2023-03-15: qty 15

## 2023-03-15 MED ORDER — LIDOCAINE VISCOUS HCL 2 % MT SOLN
15.0000 mL | Freq: Once | OROMUCOSAL | Status: AC
  Administered 2023-03-15: 15 mL via OROMUCOSAL

## 2023-03-15 MED ORDER — FAMOTIDINE 20 MG PO TABS: 20.0000 mg | ORAL_TABLET | Freq: Two times a day (BID) | ORAL | 0 refills | Status: DC

## 2023-03-15 MED ORDER — PANTOPRAZOLE SODIUM 40 MG PO TBEC: 40.0000 mg | DELAYED_RELEASE_TABLET | Freq: Every day | ORAL | 0 refills | Status: DC

## 2023-03-15 MED ORDER — ALUM & MAG HYDROXIDE-SIMETH 200-200-20 MG/5ML PO SUSP
ORAL | Status: AC
  Filled 2023-03-15: qty 30

## 2023-03-15 NOTE — ED Provider Notes (Signed)
[facility]    CSN: [id number] Arrival date & time: 03/15/23  1219     History   Chief Complaint Chief Complaint  Patient presents with   URI   Abdominal Pain    HPI Rheya Wolak is a 32 y.o. female.  Last night developed epigastric pain. Went away overnight. This morning returned. Now rating 10/10 pain. Feels like a burning pain traveling upwards. No pain in the chest Slight nausea, no vomiting. No diarrhea or constipation Denies urinary symptoms No fever No history of this Has not attempted intervention yet  LMP 12/5, denies possibility of pregnancy   Also 3 days of runny nose, dry cough and chest soreness No fever or chills Has used mucinex which helps Possible sick contacts at work   Past Medical History:  Diagnosis Date   Anemia affecting pregnancy, antepartum 01/27/2018   GBS (group B Streptococcus carrier), +RV culture, currently pregnant 07/25/2018   Gonorrhea affecting pregnancy 07/25/2018   Group B streptococcal carriage complicating pregnancy 07/25/2018   Medical history non-contributory     Patient Active Problem List   Diagnosis Date Noted   Group B streptococcus urinary tract infection affecting pregnancy 05/23/2021   [place] interval between pregnancies affecting pregnancy, antepartum 07/13/2019   Supervision of other normal pregnancy, antepartum 07/04/2019    Past Surgical History:  Procedure Laterality Date   NO PAST SURGERIES      OB History     Gravida  5   Para  5   Term  5   Preterm      AB      Living  5      SAB      IAB      Ectopic      Multiple  0   Live Births  5            Home Medications    Prior to Admission medications   Medication Sig Start Date End Date Taking? Authorizing Provider  famotidine (PEPCID) 20 MG tablet Take 1 tablet (20 mg total) by mouth 2 (two) times daily. 03/15/23  Yes Demba Nigh, Lurena Joiner, PA-C  pantoprazole (PROTONIX) 40 MG tablet Take 1 tablet (40 mg total) by mouth  daily for 14 days. 03/15/23 03/29/23 Yes Aragon Scarantino, Lurena Joiner, PA-C  norelgestromin-ethinyl estradiol Burr Medico) 150-35 MCG/24HR transdermal patch APPLY 1 PATCH TOPICALLY TO THE SKIN 1 TIME A WEEK 01/26/23   Anyanwu, Jethro Bastos, MD    Family History Family History  Problem Relation Age of Onset   Multiple sclerosis Mother     Social History Social History   Tobacco Use   Smoking status: Never   Smokeless tobacco: Never  Vaping Use   Vaping status: Never Used  Substance Use Topics   Alcohol use: Not Currently    Comment: occ wine- stop with pregnancy   Drug use: No     Allergies   Patient has no known allergies.   Review of Systems Review of Systems  Gastrointestinal:  Positive for abdominal pain.   Per HPI  Physical Exam Triage Vital Signs ED Triage Vitals  Encounter Vitals Group     BP 03/15/23 1309 108/60     Systolic BP Percentile --      Diastolic BP Percentile --      Pulse Rate 03/15/23 1309 71     Resp 03/15/23 1309 16     Temp 03/15/23 1309 97.9 F (36.6 C)     Temp Source 03/15/23 1309 Oral  SpO2 03/15/23 1309 96 %     Weight --      Height --      Head Circumference --      Peak Flow --      Pain Score 03/15/23 1304 10     Pain Loc --      Pain Education --      Exclude from Growth Chart --    No data found.  Updated Vital Signs BP 108/60 (BP Location: Left Arm)   Pulse 71   Temp 97.9 F (36.6 C) (Oral)   Resp 16   LMP 02/19/2023   SpO2 96%    Physical Exam Vitals and nursing note reviewed.  Constitutional:      General: She is not in acute distress.    Appearance: Normal appearance. She is not diaphoretic.  HENT:     Mouth/Throat:     Mouth: Mucous membranes are moist.     Pharynx: Oropharynx is clear.  Eyes:     Conjunctiva/sclera: Conjunctivae normal.  Cardiovascular:     Rate and Rhythm: Normal rate and regular rhythm.     Heart sounds: Normal heart sounds.  Pulmonary:     Effort: Pulmonary effort is normal.     Breath  sounds: Normal breath sounds.  Abdominal:     Palpations: Abdomen is soft.     Tenderness: There is abdominal tenderness in the epigastric area. There is no right CVA tenderness, left CVA tenderness, guarding or rebound.     Comments: Tender epigastric without guarding or rebound. Improved after medicine   Musculoskeletal:        General: Normal range of motion.  Skin:    General: Skin is warm and dry.  Neurological:     Mental Status: She is alert and oriented to person, place, and time.  Psychiatric:        Mood and Affect: Affect is tearful.     UC Treatments / Results  Labs (all labs ordered are listed, but only abnormal results are displayed) Labs Reviewed  POC COVID19/FLU A&B COMBO    EKG  Radiology No results found.  Procedures Procedures   Medications Ordered in UC Medications  alum & mag hydroxide-simeth (MAALOX/MYLANTA) 200-200-20 MG/5ML suspension 30 mL (30 mLs Oral Given 03/15/23 1352)  lidocaine (XYLOCAINE) 2 % viscous mouth solution 15 mL (15 mLs Mouth/Throat Given 03/15/23 1352)    Initial Impression / Assessment and Plan / UC Course  I have reviewed the triage vital signs and the nursing notes.  Pertinent labs & imaging results that were available during my care of the patient were reviewed by me and considered in my medical decision making (see chart for details).  GI cocktail given with improvement, pain now 5/10. Non tender on re-examination. Consider several etiologies such as gastritis, ulcer, reflux, etc. Discussed Protonix daily, pepcid BID, foods to avoid. Return and ED precautions   Patient requesting covid/flu test today, negative. Discussed symptomatic care and OTC medications, likely prognosis of respiratory virus. Clear lungs, no indication for xray at this time.  Work note provided  Final Clinical Impressions(s) / UC Diagnoses   Final diagnoses:  Acute gastritis without hemorrhage, unspecified gastritis type     Discharge  Instructions      Covid and flu test was negative Continue symptomatic care; mucinex, fluids, try Delsym, tylenol, etc  Protonix (pantoprazole) once every morning for the next 14 days. It can take 1-3 days to start working. Take first thing in the morning on  an empty stomach.  Remain upright for 30 minutes after taking medicine.  Please take the full 14-day course.  You can use Pepcid at home 1-2 times daily as needed.  Bland diet for the next 2 weeks.  Avoid fatty, greasy, citrusy, spicy, caffeine and alcohol as these can worsen symptoms.  Do not use any ibuprofen/Advil or other NSAIDs as these can worsen symptoms.  If at any point your symptoms worsen, including severe pain or inability to tolerate fluids, please go directly to the emergency department.     ED Prescriptions     Medication Sig Dispense Auth. Provider   pantoprazole (PROTONIX) 40 MG tablet Take 1 tablet (40 mg total) by mouth daily for 14 days. 14 tablet Ivory Maduro, PA-C   famotidine (PEPCID) 20 MG tablet Take 1 tablet (20 mg total) by mouth 2 (two) times daily. 60 tablet Ameriah Lint, Lurena Joiner, PA-C      PDMP not reviewed this encounter.   Marlow Baars, [place] 03/15/23 1430

## 2023-03-15 NOTE — ED Triage Notes (Signed)
Runny nose, chest congestion and soreness for 3 days.  Last night started having sharp pain in stomach.  Denies vomiting.morning bm was softer than usual.  Continues having sharp pain, this sharp pain is epigastric area.    Has had mucinex.

## 2023-03-15 NOTE — ED Notes (Signed)
REVIEWED WORK NOTE

## 2023-03-15 NOTE — Discharge Instructions (Signed)
Covid and flu test was negative Continue symptomatic care; mucinex, fluids, try Delsym, tylenol, etc  Protonix (pantoprazole) once every morning for the next 14 days. It can take 1-3 days to start working. Take first thing in the morning on an empty stomach.  Remain upright for 30 minutes after taking medicine.  Please take the full 14-day course.  You can use Pepcid at home 1-2 times daily as needed.  Bland diet for the next 2 weeks.  Avoid fatty, greasy, citrusy, spicy, caffeine and alcohol as these can worsen symptoms.  Do not use any ibuprofen/Advil or other NSAIDs as these can worsen symptoms.  If at any point your symptoms worsen, including severe pain or inability to tolerate fluids, please go directly to the emergency department.

## 2023-08-06 ENCOUNTER — Emergency Department (HOSPITAL_COMMUNITY)
Admission: EM | Admit: 2023-08-06 | Discharge: 2023-08-06 | Disposition: A | Discharge status: Home / Self Care | Attending: Emergency Medicine | Admitting: Emergency Medicine

## 2023-08-06 ENCOUNTER — Encounter (HOSPITAL_COMMUNITY): Payer: Self-pay

## 2023-08-06 ENCOUNTER — Emergency Department (HOSPITAL_COMMUNITY)

## 2023-08-06 ENCOUNTER — Other Ambulatory Visit: Payer: Self-pay

## 2023-08-06 DIAGNOSIS — M545 Low back pain, unspecified: Secondary | ICD-10-CM | POA: Diagnosis not present

## 2023-08-06 DIAGNOSIS — S3992XA Unspecified injury of lower back, initial encounter: Secondary | ICD-10-CM | POA: Diagnosis present

## 2023-08-06 DIAGNOSIS — Y92410 Unspecified street and highway as the place of occurrence of the external cause: Secondary | ICD-10-CM | POA: Insufficient documentation

## 2023-08-06 DIAGNOSIS — S39012A Strain of muscle, fascia and tendon of lower back, initial encounter: Secondary | ICD-10-CM | POA: Insufficient documentation

## 2023-08-06 DIAGNOSIS — Z041 Encounter for examination and observation following transport accident: Secondary | ICD-10-CM | POA: Diagnosis not present

## 2023-08-06 LAB — POC URINE PREG, ED: Preg Test, Ur: NEGATIVE

## 2023-08-06 MED ORDER — CYCLOBENZAPRINE HCL 10 MG PO TABS: 10.0000 mg | ORAL_TABLET | Freq: Two times a day (BID) | ORAL | 0 refills | Status: DC | PRN

## 2023-08-06 MED ORDER — IBUPROFEN 600 MG PO TABS: 600.0000 mg | ORAL_TABLET | Freq: Four times a day (QID) | ORAL | 0 refills | Status: DC | PRN

## 2023-08-06 MED ORDER — IBUPROFEN 800 MG PO TABS
800.0000 mg | ORAL_TABLET | Freq: Once | ORAL | Status: AC
Start: 2023-08-06 — End: 2023-08-06
  Administered 2023-08-06: 800 mg via ORAL
  Filled 2023-08-06: qty 1

## 2023-08-06 MED ORDER — ACETAMINOPHEN 500 MG PO TABS
1000.0000 mg | ORAL_TABLET | Freq: Once | ORAL | Status: AC
  Administered 2023-08-06: 1000 mg via ORAL
  Filled 2023-08-06: qty 2
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
## 2023-08-06 NOTE — ED Notes (Signed)
 Pt back from x ray, pt continues to report lower back pain, pt requests pain med, provider notified.
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
## 2023-12-17 ENCOUNTER — Ambulatory Visit: Admitting: *Deleted

## 2023-12-17 DIAGNOSIS — Z3200 Encounter for pregnancy test, result unknown: Secondary | ICD-10-CM

## 2023-12-17 DIAGNOSIS — Z3201 Encounter for pregnancy test, result positive: Secondary | ICD-10-CM | POA: Diagnosis not present

## 2023-12-17 LAB — POCT PREGNANCY, URINE: Preg Test, Ur: POSITIVE — AB

## 2023-12-17 NOTE — Patient Instructions (Signed)

## 2023-12-17 NOTE — Progress Notes (Signed)
 Possible Pregnancy  Walk in today for pregnancy confirmation. UPT in office today is positive. Attempted to call patient but did not answer; left message requesting a call back.  Called patient back at 1630 and was able to speak with her.  She reports some mild cramping (no bleeding) and her first positive home UPT on 12/05/23. Reviewed dating with patient:   LMP: Her guess is 10/18/23 but unsure EDD: 07/25/2023 8w 4d today  OB history reviewed. Reviewed medications and allergies with patient; list of medications safe to take during pregnancy given.  Recommended pt begin prenatal vitamin and schedule prenatal care.  Also reviewed MAU location and indications to go to MAU.   Rosina, RN

## 2023-12-22 ENCOUNTER — Other Ambulatory Visit: Payer: Self-pay

## 2023-12-22 ENCOUNTER — Other Ambulatory Visit

## 2023-12-22 DIAGNOSIS — Z3201 Encounter for pregnancy test, result positive: Secondary | ICD-10-CM

## 2023-12-23 ENCOUNTER — Other Ambulatory Visit: Payer: Self-pay | Admitting: Obstetrics and Gynecology

## 2023-12-23 ENCOUNTER — Ambulatory Visit (INDEPENDENT_AMBULATORY_CARE_PROVIDER_SITE_OTHER)

## 2023-12-23 DIAGNOSIS — Z3491 Encounter for supervision of normal pregnancy, unspecified, first trimester: Secondary | ICD-10-CM | POA: Diagnosis not present

## 2023-12-23 DIAGNOSIS — Z3201 Encounter for pregnancy test, result positive: Secondary | ICD-10-CM

## 2023-12-23 DIAGNOSIS — Z3A09 9 weeks gestation of pregnancy: Secondary | ICD-10-CM | POA: Diagnosis not present

## 2024-01-01 ENCOUNTER — Ambulatory Visit (HOSPITAL_COMMUNITY)
Admission: EM | Admit: 2024-01-01 | Discharge: 2024-01-01 | Disposition: A | Discharge status: Home / Self Care | Attending: Physician Assistant | Admitting: Physician Assistant

## 2024-01-01 ENCOUNTER — Encounter (HOSPITAL_COMMUNITY): Payer: Self-pay

## 2024-01-01 DIAGNOSIS — Z973 Presence of spectacles and contact lenses: Secondary | ICD-10-CM | POA: Diagnosis not present

## 2024-01-01 DIAGNOSIS — H109 Unspecified conjunctivitis: Secondary | ICD-10-CM

## 2024-01-01 MED ORDER — OFLOXACIN 0.3 % OP SOLN: 1.0000 [drp] | Freq: Four times a day (QID) | OPHTHALMIC | 0 refills | Status: DC

## 2024-01-01 MED ORDER — TETRACAINE HCL 0.5 % OP SOLN
OPHTHALMIC | Status: AC
  Filled 2024-01-01: qty 4

## 2024-01-01 NOTE — ED Provider Notes (Signed)
 [facility]    CSN: [id number] Arrival date & time: 01/01/24  0908      History   Chief Complaint Chief Complaint  Patient presents with   Eye Pain    HPI Kristen Grimes is a 33 y.o. female.   Patient presents today with a 2-day history of left eye irritation.  She does wear contacts and so initially thought that this was related to her contact use and so took the contact out and started using lubricating eyedrops and Visine without improvement.  She has also been using a warm rag on her eye.  She reports some drainage but has not had significant overnight drainage.  She reports just a general sensation that the eye is irritated but denies any foreign body sensation.  She denies any ocular trauma, visual disturbance, photophobia, fever, nausea, vomiting.  Denies any known sick contacts.  She denies any exposure to fine particulate matter or chemicals.  She is currently [redacted] weeks pregnant.    Past Medical History:  Diagnosis Date   Anemia affecting pregnancy, antepartum 01/27/2018   GBS (group B Streptococcus carrier), +RV culture, currently pregnant 07/25/2018   Gonorrhea affecting pregnancy 07/25/2018   Group B streptococcal carriage complicating pregnancy 07/25/2018   Medical history non-contributory     Patient Active Problem List   Diagnosis Date Noted   Group B streptococcus urinary tract infection affecting pregnancy 05/23/2021   Dugal interval between pregnancies affecting pregnancy, antepartum 07/13/2019   Supervision of other normal pregnancy, antepartum 07/04/2019    Past Surgical History:  Procedure Laterality Date   NO PAST SURGERIES      OB History     Gravida  6   Para  5   Term  5   Preterm      AB      Living  5      SAB      IAB      Ectopic      Multiple  0   Live Births  5            Home Medications    Prior to Admission medications   Medication Sig Start Date End Date Taking? Authorizing Provider  ofloxacin  (OCUFLOX) 0.3 % ophthalmic solution Place 1 drop into the left eye 4 (four) times daily. 01/01/24  Yes Delbra Zellars K, PA-C  famotidine  (PEPCID ) 20 MG tablet Take 1 tablet (20 mg total) by mouth 2 (two) times daily. 03/15/23   Rising, [place]berry, PA-C    Family History Family History  Problem Relation Age of Onset   Multiple sclerosis Mother     Social History Social History   Tobacco Use   Smoking status: Never   Smokeless tobacco: Never  Vaping Use   Vaping status: Never Used  Substance Use Topics   Alcohol use: Not Currently    Comment: occ wine- stop with pregnancy   Drug use: No     Allergies   Patient has no known allergies.   Review of Systems Review of Systems  Constitutional:  Negative for activity change, appetite change, fatigue and fever.  HENT:  Negative for congestion.   Eyes:  Positive for discharge, redness and itching. Negative for photophobia, pain and visual disturbance.  Respiratory:  Negative for cough.   Gastrointestinal:  Negative for diarrhea, nausea and vomiting.  Neurological:  Negative for dizziness, light-headedness and headaches.     Physical Exam Triage Vital Signs ED Triage Vitals  Encounter Vitals Group  BP 01/01/24 1006 123/75     Girls Systolic BP Percentile --      Girls Diastolic BP Percentile --      Boys Systolic BP Percentile --      Boys Diastolic BP Percentile --      Pulse Rate 01/01/24 1006 81     Resp 01/01/24 1006 16     Temp 01/01/24 1006 98.4 F (36.9 C)     Temp Source 01/01/24 1006 Oral     SpO2 01/01/24 1006 98 %     Weight 01/01/24 1006 163 lb (73.9 kg)     Height 01/01/24 1006 5' 9 (1.753 m)     Head Circumference --      Peak Flow --      Pain Score 01/01/24 1005 10     Pain Loc --      Pain Education --      Exclude from Growth Chart --    No data found.  Updated Vital Signs BP 123/75 (BP Location: Right Arm)   Pulse 81   Temp 98.4 F (36.9 C) (Oral)   Resp 16   Ht 5' 9 (1.753 m)   Wt 163  lb (73.9 kg)   LMP 10/18/2023 (Approximate)   SpO2 98%   BMI 24.07 kg/m   Visual Acuity Right Eye Distance: 20/25 (without correction) Left Eye Distance: 20/70 (without correction) Bilateral Distance: 20/15 (without correction)  Right Eye Near:   Left Eye Near:    Bilateral Near:     Physical Exam Vitals reviewed.  Constitutional:      General: She is awake. She is not in acute distress.    Appearance: Normal appearance. She is well-developed. She is not ill-appearing.     Comments: Very pleasant female appears stated age in no acute distress sitting comfortably in exam room  HENT:     Head: Normocephalic and atraumatic.     Mouth/Throat:     Pharynx: Uvula midline. No oropharyngeal exudate or posterior oropharyngeal erythema.  Eyes:     General: Lids are everted, no foreign bodies appreciated.     Extraocular Movements: Extraocular movements intact.     Conjunctiva/sclera:     Right eye: Right conjunctiva is not injected. No chemosis.    Left eye: Left conjunctiva is injected. No chemosis.    Pupils: Pupils are equal, round, and reactive to light.     Left eye: No corneal abrasion or fluorescein  uptake. Seidel exam negative. Cardiovascular:     Rate and Rhythm: Normal rate and regular rhythm.     Heart sounds: Normal heart sounds, S1 normal and S2 normal. No murmur heard. Pulmonary:     Effort: Pulmonary effort is normal.     Breath sounds: Normal breath sounds. No wheezing, rhonchi or rales.     Comments: Clear to auscultation bilaterally Psychiatric:        Behavior: Behavior is cooperative.      UC Treatments / Results  Labs (all labs ordered are listed, but only abnormal results are displayed) Labs Reviewed - No data to display  EKG   Radiology No results found.  Procedures Procedures (including critical care time)  Medications Ordered in UC Medications - No data to display  Initial Impression / Assessment and Plan / UC Course  I have reviewed the  triage vital signs and the nursing notes.  Pertinent labs & imaging results that were available during my care of the patient were reviewed by me and considered in my  medical decision making (see chart for details).     Patient is well-appearing, afebrile, nontoxic, nontachycardic.  Fluorescein  staining was performed in clinic and was normal with no evidence of corneal abrasion or ulceration.  Her vision is decreased in her left eye but she is not currently wearing her contact.  Will start ofloxacin drops given she is a contact lens user which per [employer] is safe during pregnancy.  We discussed that she should wash her hands before handling medication and avoid touching the tip of medication bottle to her eye as this can contaminate the medicine.  She is not to use a contact lens until her symptoms have resolved and she has stopped using the medication.  We discussed that if her symptoms do not improve quickly with the medicine she should follow-up with ophthalmologist and was given the contact information for local provider who is on-call with instruction call to schedule appointment.  If she has any worsening symptoms she needs to be seen immediately.  Strict return precautions given.  Excuse note declined.  Final Clinical Impressions(s) / UC Diagnoses   Final diagnoses:  Bacterial conjunctivitis of left eye  Uses contact lenses     Discharge Instructions      We are treating you for an eye infection.  Apply ofloxacin drops 4 times daily for 1 week.  Do not use contacts until your symptoms have gone away.  You can use lubricating eyedrops/artificial tears.  Make sure to wash your hands before handling medication and do not touch the tip of the medication bottle to your eye as this can contaminate the medicine.  If your symptoms are not improving quickly please follow-up with ophthalmology.  If anything worsens and you have vision change, fever, headache, dizziness, nausea/vomiting interfering  with oral intake you need to be seen immediately.     ED Prescriptions     Medication Sig Dispense Auth. Provider   ofloxacin (OCUFLOX) 0.3 % ophthalmic solution Place 1 drop into the left eye 4 (four) times daily. 5 mL Tenasia Aull K, PA-C      PDMP not reviewed this encounter.   Sherrell Rocky POUR, PA-C 01/01/24 1111

## 2024-01-01 NOTE — ED Triage Notes (Signed)
 Patient presenting with irritation to the left eye for 2 days. States there is a burning sensation as well. Patient does wear contacts. No known sick exposure or anyone with similar symptoms.   Tried visine eye drops with no relief.

## 2024-01-01 NOTE — Discharge Instructions (Signed)
 We are treating you for an eye infection.  Apply ofloxacin drops 4 times daily for 1 week.  Do not use contacts until your symptoms have gone away.  You can use lubricating eyedrops/artificial tears.  Make sure to wash your hands before handling medication and do not touch the tip of the medication bottle to your eye as this can contaminate the medicine.  If your symptoms are not improving quickly please follow-up with ophthalmology.  If anything worsens and you have vision change, fever, headache, dizziness, nausea/vomiting interfering with oral intake you need to be seen immediately.

## 2024-01-05 ENCOUNTER — Telehealth

## 2024-01-05 DIAGNOSIS — Z3481 Encounter for supervision of other normal pregnancy, first trimester: Secondary | ICD-10-CM | POA: Diagnosis not present

## 2024-01-05 DIAGNOSIS — Z3491 Encounter for supervision of normal pregnancy, unspecified, first trimester: Secondary | ICD-10-CM

## 2024-01-05 DIAGNOSIS — Z3A10 10 weeks gestation of pregnancy: Secondary | ICD-10-CM | POA: Diagnosis not present

## 2024-01-05 DIAGNOSIS — Z3490 Encounter for supervision of normal pregnancy, unspecified, unspecified trimester: Secondary | ICD-10-CM | POA: Insufficient documentation

## 2024-01-05 DIAGNOSIS — Z3480 Encounter for supervision of other normal pregnancy, unspecified trimester: Secondary | ICD-10-CM

## 2024-01-05 MED ORDER — GOJJI WEIGHT SCALE MISC: 1.0000 | Freq: Every day | 0 refills | Status: AC

## 2024-01-05 MED ORDER — PRENATAL VITAMIN 27-0.8 MG PO TABS: 1.0000 | ORAL_TABLET | Freq: Every day | ORAL | 2 refills | Status: DC

## 2024-01-05 MED ORDER — BLOOD PRESSURE KIT DEVI: 1.0000 | Freq: Every day | 0 refills | Status: AC

## 2024-01-05 NOTE — Patient Instructions (Addendum)
 Safe Medications in Pregnancy   Acne:  Benzoyl Peroxide  Salicylic Acid   Backache/Headache:  Tylenol : 2 regular strength every 4 hours OR               2 Extra strength every 6 hours   Colds/Coughs/Allergies:  Benadryl  (alcohol free) 25 mg every 6 hours as needed  Breath right strips  Claritin  Cepacol throat lozenges  Chloraseptic throat spray  Cold-Eeze- up to three times per day  Cough drops, alcohol free  Flonase  (by prescription only)  Guaifenesin  Mucinex  Robitussin DM (plain only, alcohol free)  Saline nasal spray/drops  Sudafed (pseudoephedrine) & Actifed * use only after [redacted] weeks gestation and if you do not have high blood pressure  Tylenol   Vicks Vaporub  Zinc lozenges  Zyrtec   Constipation:  Colace  Ducolax suppositories  Fleet enema  Glycerin  suppositories  Metamucil  Milk of magnesia  Miralax  Senokot  Smooth move tea   Diarrhea:  Kaopectate  Imodium A-D   *NO pepto Bismol   Hemorrhoids:  Anusol  Anusol HC  Preparation H  Tucks   Indigestion:  Tums  Maalox  Mylanta  Zantac  Pepcid   Insomnia:  Benadryl  (alcohol free) 25mg  every 6 hours as needed  Tylenol  PM  Unisom, no Gelcaps   Leg Cramps:  Tums  MagGel   Nausea/Vomiting:  Bonine  Dramamine  Emetrol  Ginger extract  Sea bands  Meclizine  Nausea medication to take during pregnancy:  Unisom (doxylamine succinate 25 mg tablets) Take one tablet daily at bedtime. If symptoms are not adequately controlled, the dose can be increased to a maximum recommended dose of two tablets daily (1/2 tablet in the morning, 1/2 tablet mid-afternoon and one at bedtime).  Vitamin B6 100mg  tablets. Take one tablet twice a day (up to 200 mg per day).   Skin Rashes:  Aveeno products  Benadryl  cream or 25mg  every 6 hours as needed  Calamine Lotion  1% cortisone cream   Yeast infection:  Gyne-lotrimin 7  Monistat 7    **If taking multiple medications, please check labels to avoid  duplicating the same active ingredients  **take medication as directed on the label  ** Do not exceed 4000 mg of tylenol  in 24 hours  **Do not take medications that contain aspirin or ibuprofen            Considering Waterbirth? Guide for patients at [facility]) Why consider waterbirth? Gentle birth for babies  Less pain medicine used in labor  May allow for passive descent/less pushing  May reduce perineal tears  More mobility and instinctive maternal position changes  Increased maternal relaxation   Is waterbirth safe? What are the risks of infection, drowning or other complications? Infection:  Very low risk (3.7 % for tub vs 4.8% for bed)  7 in 8000 waterbirths with documented infection  Poorly cleaned equipment most common cause  Slightly lower group B strep transmission rate  Drowning  Maternal:  Very low risk  Related to seizures or fainting  Newborn:  Very low risk. No evidence of increased risk of respiratory problems in multiple large studies  Physiological protection from breathing under water  Avoid underwater birth if there are any fetal complications  Once baby's head is out of the water, keep it out.  Birth complication  Some reports of cord trauma, but risk decreased by bringing baby to surface gradually  No evidence of increased risk of shoulder dystocia. Mothers can  usually change positions faster in water than in a bed, possibly aiding the maneuvers to free the shoulder.   There are 2 things you MUST do to have a waterbirth with [facility]: Attend a waterbirth class at [facility]   3rd Wednesday of every month from 7-9 pm (virtual during COVID) [employer] at [email or web address] or [email or web address] or by calling [phone] Bring us  the certificate from the class to your prenatal appointment or send via MyChart Meet with a midwife at 36 weeks* to see if you can still plan a waterbirth  and to sign the consent.   *We also recommend that you schedule as many of your prenatal visits with a midwife as possible.    Helpful information: You may want to bring a bathing suit top to the hospital to wear during labor but this is optional.  All other supplies are provided by the hospital. Please arrive at the hospital with signs of active labor, and do not wait at home until late in labor. It takes 45 min- 1 hour for fetal monitoring, and check in to your room to take place, plus transport and filling of the waterbirth tub.    Things that would prevent you from having a waterbirth: Premature, <37wks  Previous cesarean birth  Presence of thick meconium-stained fluid  Multiple gestation (Twins, triplets, etc.)  Uncontrolled diabetes or gestational diabetes requiring medication  Hypertension diagnosed in pregnancy or preexisting hypertension (gestational hypertension, preeclampsia, or chronic hypertension) Fetal growth restriction (your baby measures less than 10th percentile on ultrasound) Heavy vaginal bleeding  Non-reassuring fetal heart rate  Active infection (MRSA, etc.). Group B Strep is NOT a contraindication for waterbirth.  If your labor has to be induced and induction method requires continuous monitoring of the baby's heart rate  Other risks/issues identified by your obstetrical provider   Please remember that birth is unpredictable. Under certain unforeseeable circumstances your provider may advise against giving birth in the tub. These decisions will be made on a case-by-case basis and with the safety of you and your baby as our highest priority.    Updated 06/19/21  Contraindications to Waterbirth at [facility]:   History of c-section  Preterm birth less than 37 weeks  Thick, particulate meconium-stained fluid  Maternal fever over 101 degrees Fahrenheit  Heavy bleeding or signs of placental abruption  Pre-eclampsia, Chronic hypertension or Gestational Hypertension  Any  abnormal fetal heart rate pattern  If epidural analgesia is utilized during Chief Executive Officer  Multiple gestation pregnancy  Active communicable disease   Significant limitation to mobility  Preexisting Diabetes, A2 Gestational diabetes or Uncontrolled A1 Gestational diabetes  Any other indication based on medical provider discretion  Special Considerations: Some maternal conditions that may become contraindications by provider discretion are history of seizure or syncope, especially without documentation of management or resolution.  The patient will be required to leave the birthing tub if there is a situation warranting a more complete  fetal assessment, continuous fetal monitoring and/or the necessity for the infant to be delivered outside  the tub.

## 2024-01-05 NOTE — Progress Notes (Signed)
 New OB Intake  I connected with Kristen Grimes  on 01/05/24 at  1:15 PM EDT by MyChart Video Visit and verified that I am speaking with the correct person using two identifiers. Nurse is located at [facility] and pt is located at home.  I discussed the limitations, risks, security and privacy concerns of performing an evaluation and management service by telephone and the availability of in person appointments. I also discussed with the patient that there may be a patient responsible charge related to this service. The patient expressed understanding and agreed to proceed.  I explained I am completing New OB Intake today. We discussed EDD of 07/27/2024 based on US  at 9 weeks. Pt is G6P5005. I reviewed her allergies, medications and Medical/Surgical/OB history.    Patient Active Problem List   Diagnosis Date Noted   Supervision of low-risk pregnancy 01/05/2024     Concerns addressed today  Delivery Plans Plans to deliver at [facility]. Discussed the nature of our practice with multiple providers including residents and students as well as female and female providers. Due to the size of the practice, the delivering provider may not be the same as those providing prenatal care.   Patient is interested in water birth.  MyChart/Babyscripts MyChart access verified. I explained pt will have some visits in office and some virtually. Babyscripts instructions given and order placed. Patient verifies receipt of registration text/e-mail. Account successfully created and app downloaded. If patient is a candidate for Optimized scheduling, add to sticky note.   Blood Pressure Cuff/Weight Scale Blood pressure cuff ordered for patient to pick-up from [employer]. Explained after first prenatal appt pt will check weekly and document in Babyscripts. Patient does not have weight scale; order sent to [facility], patient may track weight weekly in Babyscripts.  Anatomy US  Explained first scheduled US  will be  around 19 weeks. Message sent to MFM scheduling to get anatomy US  scheduled.   Is patient a Mom+Baby Combined Care candidate?  Not a Candidate  If accepted, confirm patient does not intend to move from the area for at least 12 months, then notify Mom+Baby staff   First visit review I reviewed new OB appt with patient. Explained pt will be seen by Dr. Zina at first visit. Discussed [place] genetic screening with patient. Only needs Panorama, Horizon completed in previous pregnancy. Routine prenatal labs will be collected at new OB visit on 01/18/2024. Pt would like PAP smear done at new OB visit.    Last Pap Diagnosis  Date Value Ref Range Status  01/17/2021   Final   - Negative for intraepithelial lesion or malignancy (NILM)   Was disconnected from pt before interview could be completed. Could not reach pt via phone after disconnection. MyChart message sent to pt letting her know we can complete these questions at her New OB visit. Will need to ask pt if she is interested in CenteringPregnancy or Babyscripts Optimization at [facility] OB visit.    Cyndee JAYSON Molt, RN 01/05/2024  2:05 PM

## 2024-01-18 ENCOUNTER — Other Ambulatory Visit (HOSPITAL_COMMUNITY)
Admission: RE | Admit: 2024-01-18 | Discharge: 2024-01-18 | Disposition: A | Discharge status: Home / Self Care | Source: Ambulatory Visit | Attending: Obstetrics and Gynecology | Admitting: Obstetrics and Gynecology

## 2024-01-18 ENCOUNTER — Ambulatory Visit (INDEPENDENT_AMBULATORY_CARE_PROVIDER_SITE_OTHER): Admitting: Obstetrics and Gynecology

## 2024-01-18 ENCOUNTER — Other Ambulatory Visit: Payer: Self-pay

## 2024-01-18 ENCOUNTER — Encounter: Payer: Self-pay | Admitting: Obstetrics and Gynecology

## 2024-01-18 VITALS — BP 109/70 | Wt 163.1 lb

## 2024-01-18 DIAGNOSIS — Z3491 Encounter for supervision of normal pregnancy, unspecified, first trimester: Secondary | ICD-10-CM

## 2024-01-18 DIAGNOSIS — Z1332 Encounter for screening for maternal depression: Secondary | ICD-10-CM | POA: Diagnosis not present

## 2024-01-18 DIAGNOSIS — Z3A12 12 weeks gestation of pregnancy: Secondary | ICD-10-CM

## 2024-01-18 DIAGNOSIS — R55 Syncope and collapse: Secondary | ICD-10-CM | POA: Diagnosis not present

## 2024-01-18 NOTE — Progress Notes (Signed)
 INITIAL PRENATAL VISIT NOTE  Subjective:  Kristen Grimes is a 33 y.o. H3E4994 at [redacted]w[redacted]d by early ultrasound being seen today for her initial prenatal visit.  She has an obstetric history significant for SVD x 5. She has an uncomplicated medical history.  Pt recently noted feeling hot and wanting to pass out.  Patient reports occasional near syncopal symptoms.   . Vag. Bleeding: None.   . Denies leaking of fluid.    Past Medical History:  Diagnosis Date   Anemia affecting pregnancy, antepartum 01/27/2018   GBS (group B Streptococcus carrier), +RV culture, currently pregnant 07/25/2018   Gonorrhea affecting pregnancy 07/25/2018   Group B streptococcal carriage complicating pregnancy 07/25/2018   Medical history non-contributory     Past Surgical History:  Procedure Laterality Date   NO PAST SURGERIES      OB History  Gravida Para Term Preterm AB Living  6 5 5   5   SAB IAB Ectopic Multiple Live Births     0 5    # Outcome Date GA Lbr Len/2nd Weight Sex Type Anes PTL Lv  6 Current           5 Term 07/06/21 [redacted]w[redacted]d / 00:01 7 lb 0.4 oz (3.185 kg) M Vag-Spont EPI  LIV  4 Term 01/13/20 [redacted]w[redacted]d 03:52 / 00:08 7 lb 7 oz (3.374 kg) M Vag-Spont EPI  LIV  3 Term 07/25/18 [redacted]w[redacted]d 12:06 / 00:11 6 lb 13 oz (3.09 kg) F Vag-Spont None  LIV  2 Term 11/16/13 [redacted]w[redacted]d -21:07 / 00:01 6 lb 15.1 oz (3.15 kg) F Vag-Spont None  LIV  1 Term 07/31/10 [redacted]w[redacted]d   M Vag-Spont None N LIV    Social History   Socioeconomic History   Marital status: Single    Spouse name: Not on file   Number of children: Not on file   Years of education: Not on file   Highest education level: Not on file  Occupational History   Not on file  Tobacco Use   Smoking status: Never   Smokeless tobacco: Never  Vaping Use   Vaping status: Never Used  Substance and Sexual Activity   Alcohol use: Not Currently    Comment: occ wine- stop with pregnancy   Drug use: No   Sexual activity: Yes    Birth control/protection: None  Other  Topics Concern   Not on file  Social History Narrative   Not on file   Social Drivers of Health   Financial Resource Strain: Not on file  Food Insecurity: Not on file  Transportation Needs: Not on file  Physical Activity: Not on file  Stress: Not on file  Social Connections: Not on file    Family History  Problem Relation Age of Onset   Multiple sclerosis Mother      Current Outpatient Medications:    acetaminophen  (TYLENOL ) 500 MG tablet, Take 500 mg by mouth every 6 (six) hours as needed., Disp: , Rfl:    Blood Pressure Monitoring (BLOOD PRESSURE KIT) DEVI, 1 Device by Does not apply route daily., Disp: 1 each, Rfl: 0   guaifenesin (ROBITUSSIN) 100 MG/5ML syrup, Take 200 mg by mouth 3 (three) times daily as needed for cough., Disp: , Rfl:    Misc. Devices (GOJJI WEIGHT SCALE) MISC, 1 each by Does not apply route daily., Disp: 1 each, Rfl: 0   Prenatal Vit-Fe Fumarate-FA (PRENATAL VITAMIN) 27-0.8 MG TABS, Take 1 tablet by mouth daily., Disp: 30 tablet, Rfl: 2  No Known  Allergies  Review of Systems: Negative except for what is mentioned in HPI.  Objective:   Vitals:   01/18/24 0928  BP: 109/70  Weight: 163 lb 1.6 oz (74 kg)    Fetal Status: Fetal Heart Rate (bpm): 163         Physical Exam: BP 109/70   Wt 163 lb 1.6 oz (74 kg)   LMP 10/18/2023 (Approximate)   BMI 24.09 kg/m  CONSTITUTIONAL: Well-developed, well-nourished female in no acute distress.  NEUROLOGIC: Alert and oriented to person, place, and time. Normal reflexes, muscle tone coordination. No cranial nerve deficit noted. PSYCHIATRIC: Normal mood and affect. Normal behavior. Normal judgment and thought content. SKIN: Skin is warm and dry. No rash noted. Not diaphoretic. No erythema. No pallor. HENT:  Normocephalic, atraumatic, External right and left ear normal. Oropharynx is clear and moist EYES: Conjunctivae and EOM are normal.  NECK: Normal range of motion, supple, no masses CARDIOVASCULAR: Normal  heart rate noted, regular rhythm RESPIRATORY: Effort and breath sounds normal, no problems with respiration noted BREASTS: deferred ABDOMEN: Soft, nontender, nondistended, gravid. GU: normal appearing external female genitalia, multiparous normal appearing cervix, scant white discharge in vagina, no lesions noted Bimanual: 12 weeks sized uterus, no adnexal tenderness or palpable lesions noted MUSCULOSKELETAL: Normal range of motion. EXT:  No edema and no tenderness. 2+ distal pulses.   Assessment and Plan:  Pregnancy: G6P5005 at [redacted]w[redacted]d by early ultrasound  1. Encounter for supervision of low-risk pregnancy in first trimester (Primary) Continue routine prenatal care - Culture, OB Urine - GC/Chlamydia probe amp ([place])not at [facility] - CBC/D/Plt+RPR+Rh+ABO+RubIgG... - Hemoglobin A1c - PANORAMA PRENATAL TEST - Cytology - PAP( [place])  2. [redacted] weeks gestation of pregnancy   3. Near syncope Pt states she feels really hot then feels like she may pass out, equivocal for palpitations, will refer to OB cards - AMB Referral to Cardio Obstetrics   Preterm labor symptoms and general obstetric precautions including but not limited to vaginal bleeding, contractions, leaking of fluid and fetal movement were reviewed in detail with the patient.  Please refer to After Visit Summary for other counseling recommendations.   Return in about 4 weeks (around 02/15/2024) for ROB, in person.  Jerilynn DELENA Buddle 01/18/2024 9:47 AM

## 2024-01-19 LAB — CBC/D/PLT+RPR+RH+ABO+RUBIGG...
Antibody Screen: NEGATIVE
Basophils Absolute: 0 x10E3/uL (ref 0.0–0.2)
Basos: 1 %
EOS (ABSOLUTE): 0.1 x10E3/uL (ref 0.0–0.4)
Eos: 1 %
HCV Ab: NONREACTIVE
HIV Screen 4th Generation wRfx: NONREACTIVE
Hematocrit: 37.5 % (ref 34.0–46.6)
Hemoglobin: 12.5 g/dL (ref 11.1–15.9)
Hepatitis B Surface Ag: NEGATIVE
Immature Grans (Abs): 0 x10E3/uL (ref 0.0–0.1)
Immature Granulocytes: 0 %
Lymphocytes Absolute: 2.3 x10E3/uL (ref 0.7–3.1)
Lymphs: 37 %
MCH: 31.5 pg (ref 26.6–33.0)
MCHC: 33.3 g/dL (ref 31.5–35.7)
MCV: 95 fL (ref 79–97)
Monocytes Absolute: 0.4 x10E3/uL (ref 0.1–0.9)
Monocytes: 7 %
Neutrophils Absolute: 3.4 x10E3/uL (ref 1.4–7.0)
Neutrophils: 54 %
Platelets: 332 x10E3/uL (ref 150–450)
RBC: 3.97 x10E6/uL (ref 3.77–5.28)
RDW: 14.2 % (ref 11.7–15.4)
RPR Ser Ql: NONREACTIVE
Rh Factor: POSITIVE
Rubella Antibodies, IGG: 2.97 {index} (ref >0.99)
WBC: 6.3 x10E3/uL (ref 3.4–10.8)

## 2024-01-19 LAB — HEMOGLOBIN A1C
Est. average glucose Bld gHb Est-mCnc: 103 mg/dL
Hgb A1c MFr Bld: 5.2 % (ref 4.8–5.6)

## 2024-01-19 LAB — GC/CHLAMYDIA PROBE AMP (~~LOC~~) NOT AT ARMC
Chlamydia: NEGATIVE
Comment: NEGATIVE
Comment: NORMAL
Neisseria Gonorrhea: NEGATIVE

## 2024-01-19 LAB — HCV INTERPRETATION

## 2024-01-20 ENCOUNTER — Ambulatory Visit: Payer: Self-pay | Admitting: Obstetrics and Gynecology

## 2024-01-20 LAB — URINE CULTURE, OB REFLEX: Organism ID, Bacteria: NO GROWTH

## 2024-01-20 LAB — CULTURE, OB URINE

## 2024-01-21 LAB — CYTOLOGY - PAP
Comment: NEGATIVE
Diagnosis: NEGATIVE
High risk HPV: NEGATIVE

## 2024-01-24 LAB — PANORAMA PRENATAL TEST FULL PANEL:PANORAMA TEST PLUS 5 ADDITIONAL MICRODELETIONS: FETAL FRACTION: 11.2

## 2024-02-16 ENCOUNTER — Ambulatory Visit: Admitting: Obstetrics and Gynecology

## 2024-02-16 ENCOUNTER — Encounter: Payer: Self-pay | Admitting: Obstetrics and Gynecology

## 2024-02-16 VITALS — BP 120/62 | HR 85 | Wt 174.7 lb

## 2024-02-16 DIAGNOSIS — R55 Syncope and collapse: Secondary | ICD-10-CM

## 2024-02-16 DIAGNOSIS — Z3492 Encounter for supervision of normal pregnancy, unspecified, second trimester: Secondary | ICD-10-CM

## 2024-02-16 DIAGNOSIS — Z3A16 16 weeks gestation of pregnancy: Secondary | ICD-10-CM

## 2024-02-16 NOTE — Progress Notes (Signed)
 PRENATAL VISIT NOTE  Subjective:  Kristen Grimes is a 33 y.o. G6P5005 at [redacted]w[redacted]d being seen today for ongoing prenatal care.  She is currently monitored for the following issues for this low-risk pregnancy and has Supervision of low-risk pregnancy on their problem list.  Patient reports occasionally feeling like she is going to faint, only happens when her heart starts racing. Only happens when she gets hot, happens 1-2 x per week.  Contractions: Not present. Vag. Bleeding: None.   . Denies leaking of fluid.   The following portions of the patient's history were reviewed and updated as appropriate: allergies, current medications, past family history, past medical history, past social history, past surgical history and problem list.   Objective:   Vitals:   02/16/24 1007  BP: 120/62  Pulse: 85  Weight: 174 lb 11.2 oz (79.2 kg)    Fetal Status:  Fetal Heart Rate (bpm): 150        General: Alert, oriented and cooperative. Patient is in no acute distress.  Skin: Skin is warm and dry. No rash noted.   Cardiovascular: Normal heart rate noted  Respiratory: Normal respiratory effort, no problems with respiration noted  Abdomen: Soft, gravid, appropriate for gestational age.  Pain/Pressure: Absent     Pelvic: Cervical exam deferred        Extremities: Normal range of motion.  Edema: None  Mental Status: Normal mood and affect. Normal behavior. Normal judgment and thought content.      01/18/2024   10:42 AM 06/11/2021   10:44 AM 04/19/2021   10:49 AM  Depression screen PHQ 2/9  Decreased Interest 2 1 2   Down, Depressed, Hopeless 2 0 0  PHQ - 2 Score 4 1 2   Altered sleeping 2 0 0  Tired, decreased energy 3 2 0  Change in appetite 3 0 0  Feeling bad or failure about yourself  1 0 0  Trouble concentrating 0 0 0  Moving slowly or fidgety/restless 0 0 0  Suicidal thoughts 0 0 0  PHQ-9 Score 13  3  2    Difficult doing work/chores   Not difficult at all     Data saved with a previous  flowsheet row definition        01/18/2024   10:43 AM 06/11/2021   10:45 AM 04/19/2021   10:50 AM  GAD 7 : Generalized Anxiety Score  Nervous, Anxious, on Edge 3 1 1   Control/stop worrying 3 0 0  Worry too much - different things 2 0 0  Trouble relaxing 0 0 0  Restless 0 0 0  Easily annoyed or irritable 3 3 1   Afraid - awful might happen 2 0 0  Total GAD 7 Score 13 4 2   Anxiety Difficulty  Somewhat difficult Not difficult at all    Assessment and Plan:  Pregnancy: G6P5005 at [redacted]w[redacted]d  1. Encounter for supervision of low-risk pregnancy in second trimester (Primary)  2. [redacted] weeks gestation of pregnancy  3. Near syncope Pt reports cards called her and she declined an appointment since it wasn't happening, encouraged her to call back for appt   Preterm labor symptoms and general obstetric precautions including but not limited to vaginal bleeding, contractions, leaking of fluid and fetal movement were reviewed in detail with the patient. Please refer to After Visit Summary for other counseling recommendations.   Return in about 1 month (around 03/18/2024) for low OB.  Future Appointments  Date Time Provider Department Center  03/03/2024  9:00 AM [facility]  PROVIDER 1 [facility]  03/03/2024  9:30 AM [facility] US2 [facility]    Burnard CHRISTELLA Moats, MD

## 2024-03-03 ENCOUNTER — Ambulatory Visit

## 2024-03-03 ENCOUNTER — Other Ambulatory Visit

## 2024-03-23 ENCOUNTER — Ambulatory Visit (INDEPENDENT_AMBULATORY_CARE_PROVIDER_SITE_OTHER): Admitting: Obstetrics and Gynecology

## 2024-03-23 ENCOUNTER — Other Ambulatory Visit: Payer: Self-pay

## 2024-03-23 VITALS — BP 108/66 | HR 85 | Wt 177.4 lb

## 2024-03-23 DIAGNOSIS — Z3A22 22 weeks gestation of pregnancy: Secondary | ICD-10-CM

## 2024-03-23 DIAGNOSIS — Z3492 Encounter for supervision of normal pregnancy, unspecified, second trimester: Secondary | ICD-10-CM

## 2024-03-23 DIAGNOSIS — Z3482 Encounter for supervision of other normal pregnancy, second trimester: Secondary | ICD-10-CM | POA: Diagnosis not present

## 2024-03-24 NOTE — Progress Notes (Signed)
 "  PRENATAL VISIT NOTE  Subjective:  Kristen Grimes is a 34 y.o. G6P5005 at [redacted]w[redacted]d being seen today for ongoing prenatal care.  She is currently monitored for the following issues for this low-risk pregnancy and has Supervision of low-risk pregnancy on their problem list.  Patient reports no complaints.  Contractions: Not present. Vag. Bleeding: None.  Movement: Present. Denies leaking of fluid.   The following portions of the patient's history were reviewed and updated as appropriate: allergies, current medications, past family history, past medical history, past social history, past surgical history and problem list.   Objective:   Vitals:   03/23/24 1633  BP: 108/66  Pulse: 85  Weight: 177 lb 6.4 oz (80.5 kg)    Fetal Status:  Fetal Heart Rate (bpm): 158   Movement: Present    General: Alert, oriented and cooperative. Patient is in no acute distress.  Skin: Skin is warm and dry. No rash noted.   Cardiovascular: Normal heart rate noted  Respiratory: Normal respiratory effort, no problems with respiration noted  Abdomen: Soft, gravid, appropriate for gestational age.  Pain/Pressure: Absent     Pelvic: Cervical exam deferred        Extremities: Normal range of motion.     Mental Status: Normal mood and affect. Normal behavior. Normal judgment and thought content.      01/18/2024   10:42 AM 06/11/2021   10:44 AM 04/19/2021   10:49 AM  Depression screen PHQ 2/9  Decreased Interest 2 1 2   Down, Depressed, Hopeless 2 0 0  PHQ - 2 Score 4 1 2   Altered sleeping 2 0 0  Tired, decreased energy 3 2 0  Change in appetite 3 0 0  Feeling bad or failure about yourself  1 0 0  Trouble concentrating 0 0 0  Moving slowly or fidgety/restless 0 0 0  Suicidal thoughts 0 0 0  PHQ-9 Score 13  3  2    Difficult doing work/chores   Not difficult at all     Data saved with a previous flowsheet row definition        01/18/2024   10:43 AM 06/11/2021   10:45 AM 04/19/2021   10:50 AM  GAD 7 :  Generalized Anxiety Score  Nervous, Anxious, on Edge 3 1 1   Control/stop worrying 3 0 0  Worry too much - different things 2 0 0  Trouble relaxing 0 0 0  Restless 0 0 0  Easily annoyed or irritable 3 3 1   Afraid - awful might happen 2 0 0  Total GAD 7 Score 13 4 2   Anxiety Difficulty  Somewhat difficult Not difficult at all    Assessment and Plan:  Pregnancy: G6P5005 at [redacted]w[redacted]d 1. Encounter for supervision of low-risk pregnancy in second trimester (Primary) Anatomy u/s on 1/22 28wk labs next visit  2. [redacted] weeks gestation of pregnancy  Preterm labor symptoms and general obstetric precautions including but not limited to vaginal bleeding, contractions, leaking of fluid and fetal movement were reviewed in detail with the patient. Please refer to After Visit Summary for other counseling recommendations.   Return in about 1 month (around 04/23/2024) for low risk ob, in person, fasting 2hr GTT.  Future Appointments  Date Time Provider Department Center  04/07/2024 10:00 AM [facility] PROVIDER 1 [facility]  04/07/2024 10:30 AM [facility] US3 [facility]  04/27/2024  9:00 AM [facility] LAB [facility]  04/27/2024 10:55 AM [place], Suzen Octave, MD [facility]    Bebe Furry, MD  "

## 2024-04-07 ENCOUNTER — Ambulatory Visit: Attending: Obstetrics and Gynecology | Admitting: Obstetrics

## 2024-04-07 ENCOUNTER — Ambulatory Visit (HOSPITAL_BASED_OUTPATIENT_CLINIC_OR_DEPARTMENT_OTHER)

## 2024-04-07 VITALS — BP 120/64 | HR 75

## 2024-04-07 DIAGNOSIS — Z3A24 24 weeks gestation of pregnancy: Secondary | ICD-10-CM | POA: Insufficient documentation

## 2024-04-07 DIAGNOSIS — Z148 Genetic carrier of other disease: Secondary | ICD-10-CM | POA: Insufficient documentation

## 2024-04-07 DIAGNOSIS — O0942 Supervision of pregnancy with grand multiparity, second trimester: Secondary | ICD-10-CM | POA: Insufficient documentation

## 2024-04-07 DIAGNOSIS — Z363 Encounter for antenatal screening for malformations: Secondary | ICD-10-CM | POA: Diagnosis not present

## 2024-04-07 DIAGNOSIS — O358XX0 Maternal care for other (suspected) fetal abnormality and damage, not applicable or unspecified: Secondary | ICD-10-CM

## 2024-04-07 DIAGNOSIS — Z3491 Encounter for supervision of normal pregnancy, unspecified, first trimester: Secondary | ICD-10-CM

## 2024-04-07 DIAGNOSIS — O0943 Supervision of pregnancy with grand multiparity, third trimester: Secondary | ICD-10-CM

## 2024-04-07 NOTE — Progress Notes (Signed)
 MFM Consult Note  Kristen Grimes is currently at [redacted]w[redacted]d. She was seen today for a detailed fetal anatomy scan due to grand multiparity.  She denies any significant past medical history and denies any problems in her current pregnancy.    She had a cell free DNA test earlier in her pregnancy which indicated a low risk for trisomy 61, 25, and 13. A female fetus is predicted.   Sonographic findings Single intrauterine pregnancy at 24w 1d. Fetal cardiac activity:  Observed and appears normal. Presentation: Breech. The views of the fetal anatomy were limited today due to the fetal position.  What was visualized today appeared within normal limits. Fetal biometry shows the estimated fetal weight of 1 lb 10 oz,  724 grams (67%). Amniotic fluid: Within normal limits.  MVP: 4.13 cm. Placenta: Anterior.  The patient was informed that anomalies may be missed due to technical limitations. If the fetus is in a suboptimal position or maternal habitus is increased, visualization of the fetus in the maternal uterus may be impaired.  Due to grand multiparity, a follow-up exam was scheduled in 5 weeks to reassess the views of the fetal anatomy and to assess the fetal growth.  The patient stated that all of her questions were answered.   A total of 30 minutes was spent counseling, reviewing her chart, and coordinating the care for this patient.  Greater than 50% of the time was spent in direct face-to-face contact.

## 2024-04-15 ENCOUNTER — Other Ambulatory Visit: Payer: Self-pay | Admitting: Obstetrics and Gynecology

## 2024-04-15 DIAGNOSIS — Z3491 Encounter for supervision of normal pregnancy, unspecified, first trimester: Secondary | ICD-10-CM

## 2024-04-20 ENCOUNTER — Other Ambulatory Visit: Payer: Self-pay

## 2024-04-20 DIAGNOSIS — Z3A27 27 weeks gestation of pregnancy: Secondary | ICD-10-CM

## 2024-04-27 ENCOUNTER — Other Ambulatory Visit: Payer: Self-pay

## 2024-04-27 ENCOUNTER — Encounter: Payer: Self-pay | Admitting: Family Medicine

## 2024-05-12 ENCOUNTER — Ambulatory Visit
